# Patient Record
Sex: Female | Born: 1945 | Race: White | Hispanic: No | Marital: Married | Smoking: Never smoker
Health system: Southern US, Community
[De-identification: ages and names within clinical notes are randomized; demographics above are authoritative.]

## PROBLEM LIST (undated history)

## (undated) DIAGNOSIS — K5792 Diverticulitis of intestine, part unspecified, without perforation or abscess without bleeding: Secondary | ICD-10-CM

## (undated) DIAGNOSIS — E785 Hyperlipidemia, unspecified: Secondary | ICD-10-CM

## (undated) DIAGNOSIS — G9050 Complex regional pain syndrome I, unspecified: Secondary | ICD-10-CM

---

## 2017-11-08 ENCOUNTER — Emergency Department: Payer: Medicare Other

## 2017-11-08 ENCOUNTER — Other Ambulatory Visit: Payer: Self-pay

## 2017-11-08 ENCOUNTER — Inpatient Hospital Stay
Admission: EM | Admit: 2017-11-08 | Discharge: 2017-11-11 | DRG: 872 | Disposition: A | Discharge status: Home / Self Care | Payer: Medicare Other | Attending: Internal Medicine | Admitting: Internal Medicine

## 2017-11-08 ENCOUNTER — Encounter: Payer: Self-pay | Admitting: Radiology

## 2017-11-08 DIAGNOSIS — K5792 Diverticulitis of intestine, part unspecified, without perforation or abscess without bleeding: Secondary | ICD-10-CM | POA: Diagnosis present

## 2017-11-08 DIAGNOSIS — Z8249 Family history of ischemic heart disease and other diseases of the circulatory system: Secondary | ICD-10-CM | POA: Diagnosis not present

## 2017-11-08 DIAGNOSIS — K6289 Other specified diseases of anus and rectum: Secondary | ICD-10-CM | POA: Diagnosis present

## 2017-11-08 DIAGNOSIS — N3289 Other specified disorders of bladder: Secondary | ICD-10-CM | POA: Diagnosis present

## 2017-11-08 DIAGNOSIS — E785 Hyperlipidemia, unspecified: Secondary | ICD-10-CM | POA: Diagnosis present

## 2017-11-08 DIAGNOSIS — G9050 Complex regional pain syndrome I, unspecified: Secondary | ICD-10-CM | POA: Diagnosis present

## 2017-11-08 DIAGNOSIS — A419 Sepsis, unspecified organism: Secondary | ICD-10-CM | POA: Diagnosis present

## 2017-11-08 DIAGNOSIS — R102 Pelvic and perineal pain: Secondary | ICD-10-CM | POA: Diagnosis present

## 2017-11-08 DIAGNOSIS — R32 Unspecified urinary incontinence: Secondary | ICD-10-CM | POA: Diagnosis present

## 2017-11-08 DIAGNOSIS — R112 Nausea with vomiting, unspecified: Secondary | ICD-10-CM

## 2017-11-08 DIAGNOSIS — R197 Diarrhea, unspecified: Secondary | ICD-10-CM

## 2017-11-08 DIAGNOSIS — R109 Unspecified abdominal pain: Secondary | ICD-10-CM | POA: Diagnosis not present

## 2017-11-08 DIAGNOSIS — Z833 Family history of diabetes mellitus: Secondary | ICD-10-CM

## 2017-11-08 DIAGNOSIS — R1084 Generalized abdominal pain: Secondary | ICD-10-CM

## 2017-11-08 HISTORY — DX: Hyperlipidemia, unspecified: E78.5

## 2017-11-08 HISTORY — DX: Complex regional pain syndrome I, unspecified: G90.50

## 2017-11-08 LAB — LACTIC ACID, PLASMA
LACTIC ACID, VENOUS: 2 mmol/L — AB (ref 0.5–1.9)
Lactic Acid, Venous: 4.2 mmol/L (ref 0.5–1.9)

## 2017-11-08 LAB — URINALYSIS, COMPLETE (UACMP) WITH MICROSCOPIC
BILIRUBIN URINE: NEGATIVE
Bacteria, UA: NONE SEEN
GLUCOSE, UA: NEGATIVE mg/dL
Hgb urine dipstick: NEGATIVE
KETONES UR: NEGATIVE mg/dL
LEUKOCYTES UA: NEGATIVE
Nitrite: NEGATIVE
PROTEIN: NEGATIVE mg/dL
Specific Gravity, Urine: 1.036 — ABNORMAL HIGH (ref 1.005–1.030)
pH: 6 (ref 5.0–8.0)

## 2017-11-08 LAB — CBC
HEMATOCRIT: 44.2 % (ref 35.0–47.0)
HEMOGLOBIN: 15 g/dL (ref 12.0–16.0)
MCH: 31.3 pg (ref 26.0–34.0)
MCHC: 33.8 g/dL (ref 32.0–36.0)
MCV: 92.7 fL (ref 80.0–100.0)
Platelets: 333 10*3/uL (ref 150–440)
RBC: 4.77 MIL/uL (ref 3.80–5.20)
RDW: 12.8 % (ref 11.5–14.5)
WBC: 21.8 10*3/uL — ABNORMAL HIGH (ref 3.6–11.0)

## 2017-11-08 LAB — COMPREHENSIVE METABOLIC PANEL
ALBUMIN: 4.1 g/dL (ref 3.5–5.0)
ALK PHOS: 80 U/L (ref 38–126)
ALT: 23 U/L (ref 14–54)
ANION GAP: 15 (ref 5–15)
AST: 29 U/L (ref 15–41)
BUN: 16 mg/dL (ref 6–20)
CALCIUM: 9.3 mg/dL (ref 8.9–10.3)
CO2: 22 mmol/L (ref 22–32)
Chloride: 101 mmol/L (ref 101–111)
Creatinine, Ser: 0.78 mg/dL (ref 0.44–1.00)
GFR calc non Af Amer: >60 mL/min (ref >60)
GLUCOSE: 128 mg/dL — AB (ref 65–99)
POTASSIUM: 3.7 mmol/L (ref 3.5–5.1)
Sodium: 138 mmol/L (ref 135–145)
Total Bilirubin: 0.8 mg/dL (ref 0.3–1.2)
Total Protein: 7.2 g/dL (ref 6.5–8.1)

## 2017-11-08 LAB — LIPASE, BLOOD: LIPASE: 42 U/L (ref 11–51)

## 2017-11-08 MED ORDER — SODIUM CHLORIDE 0.9 % IV BOLUS (SEPSIS)
1000.0000 mL | Freq: Once | INTRAVENOUS | Status: AC
  Administered 2017-11-08: 1000 mL via INTRAVENOUS

## 2017-11-08 MED ORDER — ONDANSETRON HCL 4 MG/2ML IJ SOLN: 4.0000 mg | Freq: Four times a day (QID) | INTRAMUSCULAR | Status: DC | PRN

## 2017-11-08 MED ORDER — OXYCODONE HCL 5 MG PO TABS
5.0000 mg | ORAL_TABLET | ORAL | Status: DC | PRN
  Administered 2017-11-09 – 2017-11-10 (×4): 5 mg via ORAL
  Filled 2017-11-08 (×5): qty 1

## 2017-11-08 MED ORDER — PROMETHAZINE HCL 25 MG/ML IJ SOLN
12.5000 mg | Freq: Four times a day (QID) | INTRAMUSCULAR | Status: DC | PRN
  Administered 2017-11-09: 12.5 mg via INTRAVENOUS
  Filled 2017-11-08: qty 1

## 2017-11-08 MED ORDER — CIPROFLOXACIN IN D5W 400 MG/200ML IV SOLN
400.0000 mg | Freq: Once | INTRAVENOUS | Status: DC
  Filled 2017-11-08: qty 200

## 2017-11-08 MED ORDER — ONDANSETRON HCL 4 MG PO TABS: 4.0000 mg | ORAL_TABLET | Freq: Four times a day (QID) | ORAL | Status: DC | PRN

## 2017-11-08 MED ORDER — ONDANSETRON HCL 4 MG/2ML IJ SOLN
4.0000 mg | Freq: Four times a day (QID) | INTRAMUSCULAR | Status: AC
  Administered 2017-11-08 – 2017-11-10 (×8): 4 mg via INTRAVENOUS
  Filled 2017-11-08 (×8): qty 2

## 2017-11-08 MED ORDER — ACETAMINOPHEN 325 MG PO TABS
650.0000 mg | ORAL_TABLET | Freq: Four times a day (QID) | ORAL | Status: DC | PRN
  Administered 2017-11-08 – 2017-11-09 (×2): 650 mg via ORAL
  Filled 2017-11-08 (×3): qty 2

## 2017-11-08 MED ORDER — ONDANSETRON HCL 4 MG/2ML IJ SOLN
4.0000 mg | Freq: Once | INTRAMUSCULAR | Status: AC
  Administered 2017-11-08: 4 mg via INTRAVENOUS

## 2017-11-08 MED ORDER — METRONIDAZOLE IN NACL 5-0.79 MG/ML-% IV SOLN
500.0000 mg | Freq: Once | INTRAVENOUS | Status: AC
  Administered 2017-11-08: 500 mg via INTRAVENOUS
  Filled 2017-11-08: qty 100

## 2017-11-08 MED ORDER — SODIUM CHLORIDE 0.9 % IV SOLN
INTRAVENOUS | Status: DC
  Administered 2017-11-08 – 2017-11-10 (×4): via INTRAVENOUS

## 2017-11-08 MED ORDER — IOPAMIDOL (ISOVUE-300) INJECTION 61%
100.0000 mL | Freq: Once | INTRAVENOUS | Status: AC | PRN
  Administered 2017-11-08: 100 mL via INTRAVENOUS

## 2017-11-08 MED ORDER — METRONIDAZOLE IN NACL 5-0.79 MG/ML-% IV SOLN
500.0000 mg | Freq: Three times a day (TID) | INTRAVENOUS | Status: DC
  Administered 2017-11-08 – 2017-11-10 (×7): 500 mg via INTRAVENOUS
  Filled 2017-11-08 (×9): qty 100

## 2017-11-08 MED ORDER — ACETAMINOPHEN 650 MG RE SUPP
650.0000 mg | Freq: Four times a day (QID) | RECTAL | Status: DC | PRN
Start: 2017-11-08 — End: 2017-11-11

## 2017-11-08 MED ORDER — MORPHINE SULFATE (PF) 2 MG/ML IV SOLN
2.0000 mg | INTRAVENOUS | Status: DC | PRN
  Administered 2017-11-08 – 2017-11-09 (×6): 2 mg via INTRAVENOUS
  Filled 2017-11-08 (×7): qty 1

## 2017-11-08 MED ORDER — CIPROFLOXACIN IN D5W 400 MG/200ML IV SOLN
400.0000 mg | Freq: Two times a day (BID) | INTRAVENOUS | Status: DC
  Administered 2017-11-08 – 2017-11-09 (×4): 400 mg via INTRAVENOUS
  Filled 2017-11-08 (×6): qty 200

## 2017-11-08 MED ORDER — ONDANSETRON HCL 4 MG/2ML IJ SOLN
INTRAMUSCULAR | Status: AC
  Filled 2017-11-08: qty 2

## 2017-11-08 MED ORDER — MORPHINE SULFATE (PF) 4 MG/ML IV SOLN
4.0000 mg | Freq: Once | INTRAVENOUS | Status: AC
  Administered 2017-11-08: 4 mg via INTRAVENOUS
  Filled 2017-11-08: qty 1

## 2017-11-08 MED ORDER — ENOXAPARIN SODIUM 40 MG/0.4ML ~~LOC~~ SOLN
40.0000 mg | SUBCUTANEOUS | Status: DC
  Administered 2017-11-08 – 2017-11-10 (×3): 40 mg via SUBCUTANEOUS
  Filled 2017-11-08 (×3): qty 0.4

## 2017-11-08 NOTE — ED Notes (Signed)
Pt assisted up to commode for diarrhea.  

## 2017-11-08 NOTE — H&P (Signed)
Sonia Ochoa at [place] NAME: Sonia Ochoa    MR#:  [id number]  DATE OF BIRTH:  02/02/1946  DATE OF ADMISSION:  11/08/2017  PRIMARY CARE PHYSICIAN: Patient, No Pcp Per   REQUESTING/REFERRING PHYSICIAN:   CHIEF COMPLAINT:   Chief Complaint  Patient presents with  . Abdominal Pain  . Emesis  . Diarrhea    HISTORY OF PRESENT ILLNESS:  Sonia Ochoa  is a 72 y.o. female with a known history of hyperlipidemia and reflex sympathetic dystrophy presents to hospital secondary to worsening abdominal pain, nausea and vomiting. Patient has known history of diverticulosis. She also has episodes of constipation and does not avoid foods that can trigger these episodes. She said she might have eaten some chicken yesterday. She was fine during the day, woke up around midnight with severe abdominal pain with nausea and vomiting. Also had a few episodes of diarrhea. Diarrhea has resolved now. CT of the abdomen showing diverticulitis. WBC and lactic acid are elevated.  Patient is being admitted for sepsis secondary to diverticulitis No fevers now. Complaints of weakness, severe nausea and vomiting along with abdominal pain.  PAST MEDICAL HISTORY:   Past Medical History:  Diagnosis Date  . Hyperlipidemia   . Reflex sympathetic dystrophy     PAST SURGICAL HISTORY:  History reviewed. No pertinent surgical history.  SOCIAL HISTORY:   Social History   Tobacco Use  . Smoking status: Never Smoker  . Smokeless tobacco: Never Used  Substance Use Topics  . Alcohol use: Yes    Frequency: Never    Comment: social drinking    FAMILY HISTORY:   Family History  Problem Relation Age of Onset  . Multiple myeloma Father   . CAD Father   . Diabetes Sister     DRUG ALLERGIES:   Allergies  Allergen Reactions  . Levofloxacin Other (See Comments)    Reaction: Chest pain and "felt like her heart was going to stop"    REVIEW OF SYSTEMS:     Review of Systems  Constitutional: Positive for fever and malaise/fatigue. Negative for chills and weight loss.  HENT: Negative for ear discharge, ear pain, hearing loss and nosebleeds.   Eyes: Negative for blurred vision, double vision and photophobia.  Respiratory: Negative for cough, hemoptysis, shortness of breath and wheezing.   Cardiovascular: Negative for chest pain, palpitations, orthopnea and leg swelling.  Gastrointestinal: Positive for abdominal pain, diarrhea, nausea and vomiting. Negative for constipation, heartburn and melena.  Genitourinary: Negative for dysuria, frequency and urgency.  Musculoskeletal: Negative for back pain, myalgias and neck pain.  Skin: Negative for rash.  Neurological: Negative for dizziness, sensory change, speech change, focal weakness and headaches.  Endo/Heme/Allergies: Does not bruise/bleed easily.  Psychiatric/Behavioral: Negative for depression.    MEDICATIONS AT HOME:   Prior to Admission medications   Not on File      VITAL SIGNS:  Blood pressure 127/64, pulse (!) 108, temperature 98.9 F (37.2 C), temperature source Oral, resp. rate 16, height 5' 4"  (1.626 m), weight 68 kg (150 lb), SpO2 93 %.  PHYSICAL EXAMINATION:   Physical Exam  GENERAL:  72 y.o.-year-old patient lying in the bed with no acute distress.  EYES: Pupils equal, round, reactive to light and accommodation. No scleral icterus. Extraocular muscles intact.  HEENT: Head atraumatic, normocephalic. Oropharynx and nasopharynx clear.  NECK:  Supple, no jugular venous distention. No thyroid enlargement, no tenderness.  LUNGS: Normal breath sounds bilaterally, no wheezing, rales,rhonchi or  crepitation. No use of accessory muscles of respiration.  CARDIOVASCULAR: S1, S2 normal. No murmurs, rubs, or gallops.  ABDOMEN: significantly tender, voluntary guarding, no rigidity or rebound tenderness. nondistended. Bowel sounds present. No organomegaly or mass.  EXTREMITIES: No pedal  edema, cyanosis, or clubbing.  NEUROLOGIC: Cranial nerves II through XII are intact. Muscle strength 5/5 in all extremities. Sensation intact. Gait not checked.  PSYCHIATRIC: The patient is alert and oriented x 3.  SKIN: No obvious rash, lesion, or ulcer.   LABORATORY PANEL:   CBC Recent Labs  Lab 11/08/17 0346  WBC 21.8*  HGB 15.0  HCT 44.2  PLT 333   ------------------------------------------------------------------------------------------------------------------  Chemistries  Recent Labs  Lab 11/08/17 0346  NA 138  K 3.7  CL 101  CO2 22  GLUCOSE 128*  BUN 16  CREATININE 0.78  CALCIUM 9.3  AST 29  ALT 23  ALKPHOS 80  BILITOT 0.8   ------------------------------------------------------------------------------------------------------------------  Cardiac Enzymes No results for input(s): TROPONINI in the last 168 hours. ------------------------------------------------------------------------------------------------------------------  RADIOLOGY:  Ct Abdomen Pelvis W Contrast  Result Date: 11/08/2017 CLINICAL DATA:  72 y/o F; sudden onset abdominal cramping, nausea, vomiting, and diarrhea 1 hour prior to admission. EXAM: CT ABDOMEN AND PELVIS WITH CONTRAST TECHNIQUE: Multidetector CT imaging of the abdomen and pelvis was performed using the standard protocol following bolus administration of intravenous contrast. CONTRAST:  172m ISOVUE-300 IOPAMIDOL (ISOVUE-300) INJECTION 61% COMPARISON:  None. FINDINGS: Lower chest: Small hiatal hernia. Moderate coronary artery calcification. Hepatobiliary: 10 mm lucency in liver segment 6 (series 2, image 25), likely cyst. No other focal liver lesion identified. Normal gallbladder. No biliary dilatation. Pancreas: Unremarkable. No pancreatic ductal dilatation or surrounding inflammatory changes. Spleen: Normal in size without focal abnormality. Adrenals/Urinary Tract: Adrenal glands are unremarkable. Kidneys are normal, without renal  calculi, focal lesion, or hydronephrosis. Bladder is unremarkable. Stomach/Bowel: Sigmoid diverticulosis with mild mucosal enhancement and wall thickening likely representing acute diverticulitis. No findings of perforation or abscess. No obstructive changes of bowel. Normal appendix. Vascular/Lymphatic: Aortic atherosclerosis. No enlarged abdominal or pelvic lymph nodes. Reproductive: Status post hysterectomy. No adnexal masses. Other: No abdominal wall hernia or abnormality. No abdominopelvic ascites. Musculoskeletal: No fracture is seen. IMPRESSION: 1. Mild acute diverticulitis. No findings of perforation or abscess. 2. Small hiatal hernia. 3. Aortic atherosclerosis and coronary artery calcification. Electronically Signed   By: LKristine GarbeM.D.   On: 11/08/2017 06:34    EKG:   Orders placed or performed during the hospital encounter of 11/08/17  . EKG 12-Lead  . EKG 12-Lead  . ED EKG  . ED EKG    IMPRESSION AND PLAN:   KRachal Ochoa is a 72y.o. female with a known history of hyperlipidemia and reflex sympathetic dystrophy presents to hospital secondary to worsening abdominal pain, nausea and vomiting.  1. Sepsis- secondary to acute diverticulitis. -WBC and lactic acid are elevated. Monitor for any complications. -CT of the abdomen on admission negative for any abscess or perforation -Started on Cipro and Flagyl -IV fluids. If symptoms worsen-we'll get a surgical consult -currently on contact isolation due to diarrhea. Stool studies are pending.  2.leukocytosis-secondary to above. Monitor on antibiotics  3. DVT prophylaxis-Lovenox  Updated family at bedside    All the records are reviewed and case discussed with ED provider. Management plans discussed with the patient, family and they are in agreement.  CODE STATUS: Full Code  TOTAL TIME TAKING CARE OF THIS PATIENT: 50 minutes.    KGladstone LighterM.D on 11/08/2017 at 10:44  AM  Between 7am to 6pm -  Pager - [phone]  After 6pm go to [email or web address] - password [email or web address] [facility]  Office  [phone]  CC: Primary care physician; Patient, No Pcp Per

## 2017-11-08 NOTE — ED Notes (Signed)
Pt cleansed of large amount of diarrhea in bed x3. Immediately after cleansing pt each time pt with diarrhea in bed. Pt states she cannot use bedpan due to "pain" on her buttocks. Pt trashing around in bed, difficult to leave cardiac monitor leads in place.

## 2017-11-08 NOTE — ED Notes (Signed)
Patient transported to CT 

## 2017-11-08 NOTE — Progress Notes (Signed)
CRITICAL VALUE ALERT  Critical Value:  Lactic Acid 4.2  Date & Time Notied:  11/08/2017 1000  Provider Notified: Nemiah CommanderKalisetti   Orders Received/Actions taken:No new orders. Will continue to monitor.

## 2017-11-08 NOTE — ED Notes (Signed)
Pt cleansed of small amount of incontinent stool in brief. Pt informed will need additional stool for stool sample. Pt continues to decline use of bedpan, states "it hurts my butt". Pt notified order for rectal tube received for sample collection. Pt verbalizes understanding.

## 2017-11-08 NOTE — ED Provider Notes (Signed)
[facility] Emergency Department Provider Note   ____________________________________________   First MD Initiated Contact with Patient 11/08/17 [phone]     (approximate)  I have reviewed the triage vital signs and the nursing notes.   HISTORY  Chief Complaint Abdominal Pain; Emesis; and Diarrhea    HPI Sonia Ochoa is a 72 y.o. female who comes into the hospital today with some vomiting diarrhea and abdominal pain.  The patient states that she vomited 3 times.  She reports that her emesis was brown and her diarrhea was brown.  She reports that she has pain all over her abdomen.  She took some Tylenol at home but it did not help.  The pain is in her mid abdomen to the top of her abdomen.  She denies any sick contacts.  She states that she has been out in public so she is unsure if she was in contact with someone sick then.  The patient rates her pain a 10 out of 10 in intensity.  She was feeling sweaty and she is never had these symptoms before.  She is here today for evaluation.  History reviewed. No pertinent past medical history.  There are no active problems to display for this patient.   History reviewed. No pertinent surgical history.  Prior to Admission medications   Not on File    Allergies Levaquin [levofloxacin in d5w]  No family history on file.  Social History Social History   Tobacco Use  . Smoking status: Never Smoker  . Smokeless tobacco: Never Used  Substance Use Topics  . Alcohol use: No    Frequency: Never  . Drug use: No    Review of Systems  Constitutional: No fever/chills Eyes: No visual changes. ENT: No sore throat. Cardiovascular: Denies chest pain. Respiratory: Denies shortness of breath. Gastrointestinal: abdominal pain.   nausea,  vomiting.   diarrhea.  No constipation. Genitourinary: Negative for dysuria. Musculoskeletal: Negative for back pain. Skin: Negative for rash. Neurological: Negative for  headaches, focal weakness or numbness.   ____________________________________________   PHYSICAL EXAM:  VITAL SIGNS: ED Triage Vitals  Enc Vitals Group     BP 11/08/17 0400 124/62     Pulse Rate 11/08/17 0400 99     Resp 11/08/17 0400 16     Temp 11/08/17 0354 98.9 F (37.2 C)     Temp Source 11/08/17 0354 Oral     SpO2 11/08/17 0400 91 %     Weight 11/08/17 0354 150 lb (68 kg)     Height 11/08/17 0354 5\' 4"  (1.626 m)     Head Circumference --      Peak Flow --      Pain Score 11/08/17 0354 4     Pain Loc --      Pain Edu? --      Excl. in GC? --     Constitutional: Alert and oriented. Well appearing and in moderate distress. Eyes: Conjunctivae are normal. PERRL. EOMI. Head: Atraumatic. Nose: No congestion/rhinnorhea. Mouth/Throat: Mucous membranes are moist.  Oropharynx non-erythematous. Cardiovascular: Normal rate, regular rhythm. Grossly normal heart sounds.  Good peripheral circulation. Respiratory: Normal respiratory effort.  No retractions. Lungs CTAB. Gastrointestinal: Soft with some diffuse tenderness to palpation. No distention.  Positive bowel sounds Musculoskeletal: No lower extremity tenderness nor edema.   Neurologic:  Normal speech and language.  Skin:  Skin is warm, dry and intact.  Psychiatric: Mood and affect are normal.  ____________________________________________   LABS (all labs ordered are  listed, but only abnormal results are displayed)  Labs Reviewed  COMPREHENSIVE METABOLIC PANEL - Abnormal; Notable for the following components:      Result Value   Glucose, Bld 128 (*)    All other components within normal limits  CBC - Abnormal; Notable for the following components:   WBC 21.8 (*)    All other components within normal limits  LACTIC ACID, PLASMA - Abnormal; Notable for the following components:   Lactic Acid, Venous 2.0 (*)    All other components within normal limits  GASTROINTESTINAL PANEL BY PCR, STOOL (REPLACES STOOL CULTURE)    LIPASE, BLOOD  URINALYSIS, COMPLETE (UACMP) WITH MICROSCOPIC  LACTIC ACID, PLASMA   ____________________________________________  EKG  ED ECG REPORT I, Rebecka Apley, the attending physician, personally viewed and interpreted this ECG.   Date: 11/08/2017  EKG Time: 0343  Rate: 92  Rhythm: normal sinus rhythm  Axis: normal  Intervals:none  ST&T Change: none  ____________________________________________  RADIOLOGY  ED MD interpretation:  CT abd and pelvis: Mild acute diverticulitis with no findings of perforation or abscess  Official radiology report(s): Ct Abdomen Pelvis W Contrast  Result Date: 11/08/2017 CLINICAL DATA:  72 y/o F; sudden onset abdominal cramping, nausea, vomiting, and diarrhea 1 hour prior to admission. EXAM: CT ABDOMEN AND PELVIS WITH CONTRAST TECHNIQUE: Multidetector CT imaging of the abdomen and pelvis was performed using the standard protocol following bolus administration of intravenous contrast. CONTRAST:  ISOVUE-300 IOPAMIDOL (ISOVUE-300) INJECTION 61% COMPARISON:  None. FINDINGS: Lower chest: Small hiatal hernia. Moderate coronary artery calcification. Hepatobiliary: 10 mm lucency in liver segment 6 (series 2, image 25), likely cyst. No other focal liver lesion identified. Normal gallbladder. No biliary dilatation. Pancreas: Unremarkable. No pancreatic ductal dilatation or surrounding inflammatory changes. Spleen: Normal in size without focal abnormality. Adrenals/Urinary Tract: Adrenal glands are unremarkable. Kidneys are normal, without renal calculi, focal lesion, or hydronephrosis. Bladder is unremarkable. Stomach/Bowel: Sigmoid diverticulosis with mild mucosal enhancement and wall thickening likely representing acute diverticulitis. No findings of perforation or abscess. No obstructive changes of bowel. Normal appendix. Vascular/Lymphatic: Aortic atherosclerosis. No enlarged abdominal or pelvic lymph nodes. Reproductive: Status post  hysterectomy. No adnexal masses. Other: No abdominal wall hernia or abnormality. No abdominopelvic ascites. Musculoskeletal: No fracture is seen. IMPRESSION: 1. Mild acute diverticulitis. No findings of perforation or abscess. 2. Small hiatal hernia. 3. Aortic atherosclerosis and coronary artery calcification. Electronically Signed   By: Mitzi Hansen M.D.   On: 11/08/2017 06:34    ____________________________________________   PROCEDURES  Procedure(s) performed: None  Procedures  Critical Care performed: No  ____________________________________________   INITIAL IMPRESSION / ASSESSMENT AND PLAN / ED COURSE  As part of my medical decision making, I reviewed the following data within the electronic MEDICAL RECORD NUMBER Notes from prior ED visits and [place] Controlled Substance Database   This is a 72 year old female who comes into the hospital today with some abdominal pain nausea vomiting and diarrhea.  Differential diagnosis includes pancreatitis, gastroenteritis, diverticulitis.  I did check some blood work on the patient to include a CBC CMP and a lipase.  The patient CBC showed a white blood cell count of 21.8.  The patient's CMP was negative and her lipase was negative.  I also ordered a lactic acid with a concern for possible mesenteric ischemia which was 2.  I sent the patient for a CT scan of her abdomen and pelvis and it showed that she does have some diverticulitis.  I ordered some ciprofloxacin  and Flagyl for the patient as well as some morphine and Zofran.  She will be admitted to the hospitalist service for her diverticulitis.      ____________________________________________   FINAL CLINICAL IMPRESSION(S) / ED DIAGNOSES  Final diagnoses:  Diverticulitis  Nausea vomiting and diarrhea  Generalized abdominal pain     ED Discharge Orders    None       Note:  This document was prepared using Dragon voice recognition software and may include unintentional  dictation errors.    Rebecka Apley, MD 11/08/17 [phone]

## 2017-11-08 NOTE — ED Triage Notes (Signed)
Pt with sudden onset of abd cramping, nausea, vomiting and diarrhea one hour pta. Pt states she felt faint while having emesis.

## 2017-11-08 NOTE — ED Notes (Signed)
Pt assisted up to commode to have diarrhea, no diarrhea produced, pt with emesis while up. Pt became pale, stated she felt like she was going to faint. Pt assisted back to bed, skin color improved while lying down. Pt informed she will need to use bedpan for diarrhea in future.

## 2017-11-08 NOTE — ED Notes (Signed)
Report to hunter, rn.  

## 2017-11-08 NOTE — ED Notes (Signed)
Critical lactic called from lab of 2.0 dr. Zenda AlpersWebster notified, no new orders received.

## 2017-11-08 NOTE — Progress Notes (Signed)
Dinamap was one Hour Behind Vitals were take 19:45

## 2017-11-09 LAB — BASIC METABOLIC PANEL
ANION GAP: 9 (ref 5–15)
BUN: 8 mg/dL (ref 6–20)
CHLORIDE: 105 mmol/L (ref 101–111)
CO2: 24 mmol/L (ref 22–32)
Calcium: 8 mg/dL — ABNORMAL LOW (ref 8.9–10.3)
Creatinine, Ser: 0.74 mg/dL (ref 0.44–1.00)
GFR calc Af Amer: >60 mL/min (ref >60)
GLUCOSE: 148 mg/dL — AB (ref 65–99)
POTASSIUM: 3.5 mmol/L (ref 3.5–5.1)
Sodium: 138 mmol/L (ref 135–145)

## 2017-11-09 LAB — C DIFFICILE QUICK SCREEN W PCR REFLEX
C DIFFICILE (CDIFF) INTERP: NOT DETECTED
C DIFFICILE (CDIFF) TOXIN: NEGATIVE
C Diff antigen: NEGATIVE

## 2017-11-09 LAB — CBC
HEMATOCRIT: 39.4 % (ref 35.0–47.0)
HEMOGLOBIN: 13.3 g/dL (ref 12.0–16.0)
MCH: 31.2 pg (ref 26.0–34.0)
MCHC: 33.9 g/dL (ref 32.0–36.0)
MCV: 92 fL (ref 80.0–100.0)
Platelets: 247 10*3/uL (ref 150–440)
RBC: 4.28 MIL/uL (ref 3.80–5.20)
RDW: 13.1 % (ref 11.5–14.5)
WBC: 11.6 10*3/uL — AB (ref 3.6–11.0)

## 2017-11-09 MED ORDER — RISAQUAD PO CAPS
1.0000 | ORAL_CAPSULE | Freq: Every day | ORAL | Status: DC
  Administered 2017-11-09 – 2017-11-11 (×3): 1 via ORAL
  Filled 2017-11-09 (×3): qty 1

## 2017-11-09 NOTE — Progress Notes (Signed)
Per Dr. Nemiah CommanderKalisetti okay to place order for K Pad

## 2017-11-09 NOTE — Progress Notes (Signed)
Per Dr. Nemiah CommanderKalisetti send off sample to test for C Diff. RN to place order.

## 2017-11-09 NOTE — Progress Notes (Addendum)
[facility] [place] [facility]   PATIENT NAME: Sonia ModestKatherine Ochoa    MR#:  [id number]  DATE OF BIRTH:  08/13/1946  SUBJECTIVE:  CHIEF COMPLAINT:   Chief Complaint  Patient presents with  . Abdominal Pain  . Emesis  . Diarrhea   - some better compared to yesterday - still with nausea, abdominal pain and diarrhea today  REVIEW OF SYSTEMS:  Review of Systems  Constitutional: Positive for malaise/fatigue. Negative for chills and fever.  Eyes: Negative for blurred vision.  Respiratory: Negative for cough, shortness of breath and wheezing.   Cardiovascular: Negative for chest pain and palpitations.  Gastrointestinal: Positive for abdominal pain, diarrhea and nausea. Negative for constipation and vomiting.  Genitourinary: Negative for dysuria.  Musculoskeletal: Positive for myalgias.  Neurological: Negative for dizziness, speech change, focal weakness, seizures and headaches.  Psychiatric/Behavioral: Negative for depression.    DRUG ALLERGIES:   Allergies  Allergen Reactions  . Levofloxacin Other (See Comments)    Reaction: Chest pain and "felt like her heart was going to stop"    VITALS:  Blood pressure 131/63, pulse 97, temperature 98.3 F (36.8 C), temperature source Oral, resp. rate 18, height 5\' 4"  (1.626 m), weight 68 kg (150 lb), SpO2 96 %.  PHYSICAL EXAMINATION:  Physical Exam  GENERAL:  72 y.o.-year-old patient lying in the bed with no acute distress.  EYES: Pupils equal, round, reactive to light and accommodation. No scleral icterus. Extraocular muscles intact.  HEENT: Head atraumatic, normocephalic. Oropharynx and nasopharynx clear.  NECK:  Supple, no jugular venous distention. No thyroid enlargement, no tenderness.  LUNGS: Normal breath sounds bilaterally, no wheezing, rales,rhonchi or crepitation. No use of accessory muscles of respiration.  CARDIOVASCULAR: S1, S2 normal. No murmurs, rubs, or gallops.  ABDOMEN: significantly  tender, voluntary guarding, no rigidity or rebound tenderness. nondistended. Bowel sounds present. No organomegaly or mass.  EXTREMITIES: No pedal edema, cyanosis, or clubbing.  NEUROLOGIC: Cranial nerves II through XII are intact. Muscle strength 5/5 in all extremities. Sensation intact. Gait not checked.  PSYCHIATRIC: The patient is alert and oriented x 3.  SKIN: No obvious rash, lesion, or ulcer.    LABORATORY PANEL:   CBC Recent Labs  Lab 11/09/17 0649  WBC 11.6*  HGB 13.3  HCT 39.4  PLT 247   ------------------------------------------------------------------------------------------------------------------  Chemistries  Recent Labs  Lab 11/08/17 0346 11/09/17 0649  NA 138 138  K 3.7 3.5  CL 101 105  CO2 22 24  GLUCOSE 128* 148*  BUN 16 8  CREATININE 0.78 0.74  CALCIUM 9.3 8.0*  AST 29  --   ALT 23  --   ALKPHOS 80  --   BILITOT 0.8  --    ------------------------------------------------------------------------------------------------------------------  Cardiac Enzymes No results for input(s): TROPONINI in the last 168 hours. ------------------------------------------------------------------------------------------------------------------  RADIOLOGY:  Ct Abdomen Pelvis W Contrast  Result Date: 11/08/2017 CLINICAL DATA:  72 y/o F; sudden onset abdominal cramping, nausea, vomiting, and diarrhea 1 hour prior to admission. EXAM: CT ABDOMEN AND PELVIS WITH CONTRAST TECHNIQUE: Multidetector CT imaging of the abdomen and pelvis was performed using the standard protocol following bolus administration of intravenous contrast. CONTRAST:  100mL ISOVUE-300 IOPAMIDOL (ISOVUE-300) INJECTION 61% COMPARISON:  None. FINDINGS: Lower chest: Small hiatal hernia. Moderate coronary artery calcification. Hepatobiliary: 10 mm lucency in liver segment 6 (series 2, image 25), likely cyst. No other focal liver lesion identified. Normal gallbladder. No biliary dilatation. Pancreas:  Unremarkable. No pancreatic ductal dilatation or surrounding inflammatory changes. Spleen: Normal  in size without focal abnormality. Adrenals/Urinary Tract: Adrenal glands are unremarkable. Kidneys are normal, without renal calculi, focal lesion, or hydronephrosis. Bladder is unremarkable. Stomach/Bowel: Sigmoid diverticulosis with mild mucosal enhancement and wall thickening likely representing acute diverticulitis. No findings of perforation or abscess. No obstructive changes of bowel. Normal appendix. Vascular/Lymphatic: Aortic atherosclerosis. No enlarged abdominal or pelvic lymph nodes. Reproductive: Status post hysterectomy. No adnexal masses. Other: No abdominal wall hernia or abnormality. No abdominopelvic ascites. Musculoskeletal: No fracture is seen. IMPRESSION: 1. Mild acute diverticulitis. No findings of perforation or abscess. 2. Small hiatal hernia. 3. Aortic atherosclerosis and coronary artery calcification. Electronically Signed   By: Mitzi Hansen M.D.   On: 11/08/2017 06:34    EKG:   Orders placed or performed during the hospital encounter of 11/08/17  . EKG 12-Lead  . EKG 12-Lead  . ED EKG  . ED EKG    ASSESSMENT AND PLAN:   [name]  is a 72 y.o. female with a known history of hyperlipidemia and reflex sympathetic dystrophy presents to hospital secondary to worsening abdominal pain, nausea and vomiting.  1. Sepsis- secondary to acute diverticulitis. - stool studies pending - added probiotics - improved wbc. -CT of the abdomen on admission negative for any abscess or perforation -on Cipro and Flagyl - advance diet today -IV fluids.   2.leukocytosis-secondary to above. Improving on antibiotics  3. DVT prophylaxis-Lovenox  4. RDS- pain meds prn  Updated family at bedside       All the records are reviewed and case discussed with Care Management/Social Workerr. Management plans discussed with the patient, family and they are in  agreement.  CODE STATUS:  Full Code  TOTAL TIME TAKING CARE OF THIS PATIENT: 38 minutes.   POSSIBLE D/C IN 2 DAYS, DEPENDING ON CLINICAL CONDITION.   [name] M.D on 11/09/2017 at 12:12 PM  Between 7am to 6pm - Pager - [phone]  After 6pm go to [email or web address] - password [email or web address]  [facility]  Office  [phone]  CC: Primary care physician; Patient, No Pcp Per

## 2017-11-10 LAB — MISC LABCORP TEST (SEND OUT): LABCORP TEST CODE: 183480

## 2017-11-10 LAB — CBC
HCT: 36.3 % (ref 35.0–47.0)
Hemoglobin: 12.6 g/dL (ref 12.0–16.0)
MCH: 31.9 pg (ref 26.0–34.0)
MCHC: 34.7 g/dL (ref 32.0–36.0)
MCV: 92 fL (ref 80.0–100.0)
PLATELETS: 228 10*3/uL (ref 150–440)
RBC: 3.94 MIL/uL (ref 3.80–5.20)
RDW: 13.2 % (ref 11.5–14.5)
WBC: 11.4 10*3/uL — ABNORMAL HIGH (ref 3.6–11.0)

## 2017-11-10 LAB — URINALYSIS, COMPLETE (UACMP) WITH MICROSCOPIC
Bilirubin Urine: NEGATIVE
GLUCOSE, UA: NEGATIVE mg/dL
Ketones, ur: NEGATIVE mg/dL
Nitrite: NEGATIVE
PH: 6 (ref 5.0–8.0)
Protein, ur: NEGATIVE mg/dL
Specific Gravity, Urine: 1.002 — ABNORMAL LOW (ref 1.005–1.030)

## 2017-11-10 MED ORDER — OXYBUTYNIN CHLORIDE 5 MG PO TABS: 5.0000 mg | ORAL_TABLET | Freq: Three times a day (TID) | ORAL | Status: DC | PRN

## 2017-11-10 MED ORDER — LOPERAMIDE HCL 2 MG PO CAPS
2.0000 mg | ORAL_CAPSULE | Freq: Four times a day (QID) | ORAL | Status: DC | PRN
  Administered 2017-11-10 (×2): 2 mg via ORAL
  Filled 2017-11-10 (×2): qty 1

## 2017-11-10 MED ORDER — BELLADONNA ALKALOIDS-OPIUM 16.2-60 MG RE SUPP
1.0000 | Freq: Three times a day (TID) | RECTAL | Status: DC
  Administered 2017-11-10 (×3): 1 via RECTAL
  Filled 2017-11-10 (×4): qty 1

## 2017-11-10 MED ORDER — CIPROFLOXACIN HCL 500 MG PO TABS
500.0000 mg | ORAL_TABLET | Freq: Two times a day (BID) | ORAL | Status: DC
  Administered 2017-11-10 – 2017-11-11 (×3): 500 mg via ORAL
  Filled 2017-11-10 (×3): qty 1

## 2017-11-10 MED ORDER — METRONIDAZOLE 500 MG PO TABS
500.0000 mg | ORAL_TABLET | Freq: Three times a day (TID) | ORAL | Status: DC
  Administered 2017-11-10 – 2017-11-11 (×3): 500 mg via ORAL
  Filled 2017-11-10 (×5): qty 1

## 2017-11-10 NOTE — Progress Notes (Signed)
Per Dr. Nemiah CommanderKalisetti okay for pt not to have IV access. IV was removed by patient but she is no longer requiring IV pain or nausea meds.

## 2017-11-10 NOTE — Progress Notes (Signed)
[facility] [place] at [facility]   PATIENT NAME: Sonia Ochoa    MR#:  [id number]  DATE OF BIRTH:  [birth year]  SUBJECTIVE:  CHIEF COMPLAINT:   Chief Complaint  Patient presents with  . Abdominal Pain  . Emesis  . Diarrhea   - complaints of pain and burning whenever trying to urinate -Also rectal pain. Diarrhea is improving. C. Difficile is negative  REVIEW OF SYSTEMS:  Review of Systems  Constitutional: Positive for malaise/fatigue. Negative for chills and fever.  Eyes: Negative for blurred vision.  Respiratory: Negative for cough, shortness of breath and wheezing.   Cardiovascular: Negative for chest pain and palpitations.  Gastrointestinal: Positive for abdominal pain, diarrhea and nausea. Negative for constipation and vomiting.  Genitourinary: Negative for dysuria.  Musculoskeletal: Positive for myalgias.  Neurological: Negative for dizziness, speech change, focal weakness, seizures and headaches.  Psychiatric/Behavioral: Negative for depression.    DRUG ALLERGIES:   Allergies  Allergen Reactions  . Levofloxacin Other (See Comments)    Reaction: Chest pain and "felt like her heart was going to stop"    VITALS:  Blood pressure 138/71, pulse 91, temperature 98.3 F (36.8 C), temperature source Oral, resp. rate 18, height 5\' 4"  (1.626 m), weight 68 kg (150 lb), SpO2 97 %.  PHYSICAL EXAMINATION:  Physical Exam  GENERAL:  72 y.o.-year-old patient lying in the bed with no acute distress.  EYES: Pupils equal, round, reactive to light and accommodation. No scleral icterus. Extraocular muscles intact.  HEENT: Head atraumatic, normocephalic. Oropharynx and nasopharynx clear.  NECK:  Supple, no jugular venous distention. No thyroid enlargement, no tenderness.  LUNGS: Normal breath sounds bilaterally, no wheezing, rales,rhonchi or crepitation. No use of accessory muscles of respiration.  CARDIOVASCULAR: S1, S2 normal. No murmurs, rubs, or  gallops.  ABDOMEN: significantly tender, voluntary guarding, no rigidity or rebound tenderness. nondistended. Bowel sounds present. No organomegaly or mass.  Genitourinary-some erythema and tenderness on labial folds and also in the perineum EXTREMITIES: No pedal edema, cyanosis, or clubbing.  NEUROLOGIC: Cranial nerves II through XII are intact. Muscle strength 5/5 in all extremities. Sensation intact. Gait not checked.  PSYCHIATRIC: The patient is alert and oriented x 3.  SKIN: No obvious rash, lesion, or ulcer.    LABORATORY PANEL:   CBC Recent Labs  Lab 11/10/17 0418  WBC 11.4*  HGB 12.6  HCT 36.3  PLT 228   ------------------------------------------------------------------------------------------------------------------  Chemistries  Recent Labs  Lab 11/08/17 0346 11/09/17 0649  NA 138 138  K 3.7 3.5  CL 101 105  CO2 22 24  GLUCOSE 128* 148*  BUN 16 8  CREATININE 0.78 0.74  CALCIUM 9.3 8.0*  AST 29  --   ALT 23  --   ALKPHOS 80  --   BILITOT 0.8  --    ------------------------------------------------------------------------------------------------------------------  Cardiac Enzymes No results for input(s): TROPONINI in the last 168 hours. ------------------------------------------------------------------------------------------------------------------  RADIOLOGY:  No results found.  EKG:   Orders placed or performed during the hospital encounter of 11/08/17  . EKG 12-Lead  . EKG 12-Lead  . ED EKG  . ED EKG    ASSESSMENT AND PLAN:   Sonia Ochoa  is a 72 y.o. female with a known history of hyperlipidemia and reflex sympathetic dystrophy presents to hospital secondary to worsening abdominal pain, nausea and vomiting.  1. Sepsis- secondary to acute diverticulitis. -C. Difficile is negative, GI panel is pending. Continue Imodium as needed - added probiotics - improved wbc. -CT of the  abdomen on admission negative for any abscess or  perforation -on Cipro and Flagyl-change to oral - advance diet today -can DC IV fluids today  2.leukocytosis-secondary to above. Improving on antibiotics  3. DVT prophylaxis-Lovenox  4. RDS- pain meds prn  5. Perineal pain-secondary to skin breakdown from incontinence and also recent diarrhea. Discontinue rectal tube -Barrier ointment twice a day. Repeat urine analysis ordered. -Belladonna suppositories ordered for bladder spasms       All the records are reviewed and case discussed with Care Management/Social Workerr. Management plans discussed with the patient, family and they are in agreement.  CODE STATUS:  Full Code  TOTAL TIME TAKING CARE OF THIS PATIENT: 38 minutes.   POSSIBLE D/C TOMORROW, DEPENDING ON CLINICAL CONDITION.   Enid BaasKALISETTI,Brianna Esson M.D on 11/10/2017 at 11:02 AM  Between 7am to 6pm - Pager - [phone]  After 6pm go to [email or web address] - password [email or web address]  [facility]  Office  [phone]  CC: Primary care physician; Patient, No Pcp Per

## 2017-11-11 MED ORDER — BELLADONNA ALKALOIDS-OPIUM 16.2-60 MG RE SUPP: 1.0000 | Freq: Three times a day (TID) | RECTAL | 0 refills | Status: DC | PRN

## 2017-11-11 MED ORDER — CIPROFLOXACIN HCL 500 MG PO TABS: 500.0000 mg | ORAL_TABLET | Freq: Two times a day (BID) | ORAL | 0 refills | Status: AC

## 2017-11-11 MED ORDER — RISAQUAD PO CAPS: 1.0000 | ORAL_CAPSULE | Freq: Every day | ORAL | 0 refills | Status: AC

## 2017-11-11 MED ORDER — METRONIDAZOLE 500 MG PO TABS: 500.0000 mg | ORAL_TABLET | Freq: Three times a day (TID) | ORAL | 0 refills | Status: AC

## 2017-11-11 MED ORDER — OXYCODONE HCL 5 MG PO TABS: 5.0000 mg | ORAL_TABLET | Freq: Four times a day (QID) | ORAL | 0 refills | Status: DC | PRN

## 2017-11-11 NOTE — Care Management Important Message (Signed)
Important Message  Patient Details  Name: Sonia Ochoa MRN: [id number] Date of Birth: 05/07/1946   Medicare Important Message Given:  Yes    Chapman FitchBOWEN, Greysin Medlen T, RN 11/11/2017, 11:39 AM

## 2017-11-11 NOTE — Discharge Summary (Signed)
[facility] -  at [facility]   PATIENT NAME: Sonia Ochoa    MR#:  [id number]  DATE OF BIRTH:  03/30/1946  DATE OF ADMISSION:  11/08/2017   ADMITTING PHYSICIAN: [name], MD  DATE OF DISCHARGE:  11/11/17  PRIMARY CARE PHYSICIAN: Patient, No Pcp Per   ADMISSION DIAGNOSIS:   Diverticulitis [K57.92] Generalized abdominal pain [R10.84] Nausea vomiting and diarrhea [R11.2, R19.7]  DISCHARGE DIAGNOSIS:   Active Problems:   Acute diverticulitis   SECONDARY DIAGNOSIS:   Past Medical History:  Diagnosis Date  . Hyperlipidemia   . Reflex sympathetic dystrophy     HOSPITAL COURSE:   KatherineTerwilligeris a72 y.o.femalewith a known history of hyperlipidemia and reflex sympathetic dystrophy presents to hospital secondary to worsening abdominal pain, nausea and vomiting.  1. Sepsis- secondary to acute diverticulitis. -C. Difficile is negative, GI panel is negative, cultures pending. Continue Imodium as needed- however diarrhea resolved - added probiotics - improved wbc. -CT of the abdomen on admission negative for any abscess or perforation, showed diverticulitis -on Cipro and Flagyl  - tolerating regular diet  2.leukocytosis-secondary to above. Improved on antibiotics  3. RDS- pain meds prn  4. Perineal pain-secondary to skin breakdown from incontinence and also recent diarrhea.   -Barrier ointment twice a day. Repeat urine analysis with no significant infection. -Belladonna suppositories ordered for bladder spasms with improvement  Discharge today    DISCHARGE CONDITIONS:   Guarded  CONSULTS OBTAINED:   None  DRUG ALLERGIES:   Allergies  Allergen Reactions  . Levofloxacin Other (See Comments)    Reaction: Chest pain and "felt like her heart was going to stop"   DISCHARGE MEDICATIONS:   Allergies as of 11/11/2017      Reactions   Levofloxacin Other (See Comments)   Reaction: Chest pain and "felt  like her heart was going to stop"      Medication List    TAKE these medications   acidophilus Caps capsule Take 1 capsule by mouth daily for 10 days. Start taking on:  11/12/2017   ciprofloxacin 500 MG tablet Commonly known as:  CIPRO Take 1 tablet (500 mg total) by mouth 2 (two) times daily for 7 days.   metroNIDAZOLE 500 MG tablet Commonly known as:  FLAGYL Take 1 tablet (500 mg total) by mouth every 8 (eight) hours for 7 days.   opium-belladonna 16.2-60 MG suppository Commonly known as:  B&O SUPPRETTES Place 1 suppository rectally every 8 (eight) hours as needed for bladder spasms.   oxyCODONE 5 MG immediate release tablet Commonly known as:  Oxy IR/ROXICODONE Take 1 tablet (5 mg total) by mouth every 6 (six) hours as needed for moderate pain or severe pain.        DISCHARGE INSTRUCTIONS:   1. PCP f/u in 1-2 weeks  DIET:   Regular diet  ACTIVITY:   Activity as tolerated  OXYGEN:   Home Oxygen: No  Oxygen Delivery: Room air  DISCHARGE LOCATION:   Home  If you experience worsening of your admission symptoms, develop shortness of breath, life threatening emergency, suicidal or homicidal thoughts you must seek medical attention immediately by calling 911 or calling your MD immediately  if symptoms less severe.  You Must read complete instructions/literature along with all the possible adverse reactions/side effects for all the Medicines you take and that have been prescribed to you. Take any new Medicines after you have completely understood and accpet all the possible adverse reactions/side effects.   Please note  You  were cared for by a hospitalist during your hospital stay. If you have any questions about your discharge medications or the care you received while you were in the hospital after you are discharged, you can call the unit and asked to speak with the hospitalist on call if the hospitalist that took care of you is not available. Once you are  discharged, your primary care physician will handle any further medical issues. Please note that NO REFILLS for any discharge medications will be authorized once you are discharged, as it is imperative that you return to your primary care physician (or establish a relationship with a primary care physician if you do not have one) for your aftercare needs so that they can reassess your need for medications and monitor your lab values.    On the day of Discharge:  VITAL SIGNS:   Blood pressure 126/65, pulse 95, temperature 98.4 F (36.9 C), temperature source Oral, resp. rate 18, height 5\' 4"  (1.626 m), weight 68 kg (150 lb), SpO2 93 %.  PHYSICAL EXAMINATION:    GENERAL:72 y.o.-year-old patient lying in the bed with no acute distress.  EYES: Pupils equal, round, reactive to light and accommodation. No scleral icterus. Extraocular muscles intact.  HEENT: Head atraumatic, normocephalic. Oropharynx and nasopharynx clear.  NECK: Supple, no jugular venous distention. No thyroid enlargement, no tenderness.  LUNGS: Normal breath sounds bilaterally, no wheezing, rales,rhonchi or crepitation. No use of accessory muscles of respiration.  CARDIOVASCULAR: S1, S2 normal. No murmurs, rubs, or gallops.  ABDOMEN:non tender, soft, nondistended. Bowel sounds present. No organomegaly or mass.  Genitourinary-some erythema and tenderness on labial folds and also in the perineum EXTREMITIES: No pedal edema, cyanosis, or clubbing.  NEUROLOGIC: Cranial nerves II through XII are intact. Muscle strength 5/5 in all extremities. Sensation intact. Gait not checked.  PSYCHIATRIC: The patient is alert and oriented x 3.  SKIN: No obvious rash, lesion, or ulcer.   DATA REVIEW:   CBC Recent Labs  Lab 11/10/17 0418  WBC 11.4*  HGB 12.6  HCT 36.3  PLT 228    Chemistries  Recent Labs  Lab 11/08/17 0346 11/09/17 0649  NA 138 138  K 3.7 3.5  CL 101 105  CO2 22 24  GLUCOSE 128* 148*  BUN 16 8  CREATININE  0.78 0.74  CALCIUM 9.3 8.0*  AST 29  --   ALT 23  --   ALKPHOS 80  --   BILITOT 0.8  --      Microbiology Results  Results for orders placed or performed during the hospital encounter of 11/08/17  C difficile quick scan w PCR reflex     Status: None   Collection Time: 11/09/17 12:20 PM  Result Value Ref Range Status   C Diff antigen NEGATIVE NEGATIVE Final   C Diff toxin NEGATIVE NEGATIVE Final   C Diff interpretation No C. difficile detected.  Final    Comment: Performed at [facility], [address] [place], [place] [address]    RADIOLOGY:  No results found.   Management plans discussed with the patient, family and they are in agreement.  CODE STATUS:     Code Status Orders  (From admission, onward)        Start     Ordered   11/08/17 0937  Full code  Continuous     11/08/17 0936    Code Status History    Date Active Date Inactive Code Status Order ID Comments User Context   This  patient has a current code status but no historical code status.      TOTAL TIME TAKING CARE OF THIS PATIENT: 38  minutes.    [name] M.D on 11/11/2017 at 10:49 AM  Between 7am to 6pm - Pager - [phone]  After 6pm go to [email or web address] - Social research officer, government  [facility] [place] [place] [facility]  Office  [phone]  CC: Primary care physician; Patient, No Pcp Per   Note: This dictation was prepared with Dragon dictation along with smaller phrase technology. Any transcriptional errors that result from this process are unintentional.

## 2017-11-11 NOTE — Progress Notes (Signed)
Patient discharge teaching given, including activity, diet, follow-up appoints, and medications. Patient verbalized understanding of all discharge instructions. IV access was d/c'd. Vitals are stable. Skin is intact except as charted in most recent assessments. Pt to be escorted out by NT, to be driven home by family.  Sonia Ochoa  

## 2017-11-20 LAB — GASTROINTESTINAL PANEL BY PCR, STOOL (REPLACES STOOL CULTURE)

## 2020-03-24 ENCOUNTER — Inpatient Hospital Stay: Payer: Medicare Other | Admitting: Anesthesiology

## 2020-03-24 ENCOUNTER — Other Ambulatory Visit: Payer: Self-pay

## 2020-03-24 ENCOUNTER — Inpatient Hospital Stay
Admission: EM | Admit: 2020-03-24 | Discharge: 2020-04-08 | DRG: 339 | Disposition: A | Discharge status: Home Health | Payer: Medicare Other | Attending: General Surgery | Admitting: General Surgery

## 2020-03-24 ENCOUNTER — Encounter
Admission: EM | Disposition: A | Discharge status: Home Health | Payer: Self-pay | Source: Home / Self Care | Attending: General Surgery

## 2020-03-24 ENCOUNTER — Emergency Department: Payer: Medicare Other

## 2020-03-24 DIAGNOSIS — K3521 Acute appendicitis with generalized peritonitis, with abscess: Secondary | ICD-10-CM | POA: Diagnosis present

## 2020-03-24 DIAGNOSIS — K352 Acute appendicitis with generalized peritonitis, without abscess: Secondary | ICD-10-CM | POA: Diagnosis present

## 2020-03-24 DIAGNOSIS — Z978 Presence of other specified devices: Secondary | ICD-10-CM

## 2020-03-24 DIAGNOSIS — G9050 Complex regional pain syndrome I, unspecified: Secondary | ICD-10-CM | POA: Diagnosis present

## 2020-03-24 DIAGNOSIS — R109 Unspecified abdominal pain: Secondary | ICD-10-CM

## 2020-03-24 DIAGNOSIS — Z833 Family history of diabetes mellitus: Secondary | ICD-10-CM | POA: Diagnosis not present

## 2020-03-24 DIAGNOSIS — Z8249 Family history of ischemic heart disease and other diseases of the circulatory system: Secondary | ICD-10-CM | POA: Diagnosis not present

## 2020-03-24 DIAGNOSIS — E785 Hyperlipidemia, unspecified: Secondary | ICD-10-CM | POA: Diagnosis present

## 2020-03-24 DIAGNOSIS — Z807 Family history of other malignant neoplasms of lymphoid, hematopoietic and related tissues: Secondary | ICD-10-CM

## 2020-03-24 DIAGNOSIS — K219 Gastro-esophageal reflux disease without esophagitis: Secondary | ICD-10-CM | POA: Diagnosis present

## 2020-03-24 DIAGNOSIS — K3532 Acute appendicitis with perforation and localized peritonitis, without abscess: Secondary | ICD-10-CM

## 2020-03-24 DIAGNOSIS — Z20822 Contact with and (suspected) exposure to covid-19: Secondary | ICD-10-CM | POA: Diagnosis present

## 2020-03-24 DIAGNOSIS — Z5331 Laparoscopic surgical procedure converted to open procedure: Secondary | ICD-10-CM

## 2020-03-24 HISTORY — PX: APPENDECTOMY: SHX54

## 2020-03-24 HISTORY — DX: Diverticulitis of intestine, part unspecified, without perforation or abscess without bleeding: K57.92

## 2020-03-24 LAB — CBC
HCT: 33.2 % — ABNORMAL LOW (ref 36.0–46.0)
Hemoglobin: 11.8 g/dL — ABNORMAL LOW (ref 12.0–15.0)
MCH: 32.4 pg (ref 26.0–34.0)
MCHC: 35.5 g/dL (ref 30.0–36.0)
MCV: 91.2 fL (ref 80.0–100.0)
Platelets: 250 10*3/uL (ref 150–400)
RBC: 3.64 MIL/uL — ABNORMAL LOW (ref 3.87–5.11)
RDW: 13.2 % (ref 11.5–15.5)
WBC: 6.6 10*3/uL (ref 4.0–10.5)
nRBC: 0 % (ref 0.0–0.2)

## 2020-03-24 LAB — COMPREHENSIVE METABOLIC PANEL
ALT: 13 U/L (ref 0–44)
AST: 19 U/L (ref 15–41)
Albumin: 3.2 g/dL — ABNORMAL LOW (ref 3.5–5.0)
Alkaline Phosphatase: 55 U/L (ref 38–126)
Anion gap: 15 (ref 5–15)
BUN: 20 mg/dL (ref 8–23)
CO2: 26 mmol/L (ref 22–32)
Calcium: 8.3 mg/dL — ABNORMAL LOW (ref 8.9–10.3)
Chloride: 94 mmol/L — ABNORMAL LOW (ref 98–111)
Creatinine, Ser: 1.69 mg/dL — ABNORMAL HIGH (ref 0.44–1.00)
GFR calc Af Amer: 34 mL/min — ABNORMAL LOW (ref >60)
GFR calc non Af Amer: 29 mL/min — ABNORMAL LOW (ref >60)
Glucose, Bld: 117 mg/dL — ABNORMAL HIGH (ref 70–99)
Potassium: 3.4 mmol/L — ABNORMAL LOW (ref 3.5–5.1)
Sodium: 135 mmol/L (ref 135–145)
Total Bilirubin: 1.2 mg/dL (ref 0.3–1.2)
Total Protein: 6.2 g/dL — ABNORMAL LOW (ref 6.5–8.1)

## 2020-03-24 LAB — LIPASE, BLOOD: Lipase: 17 U/L (ref 11–51)

## 2020-03-24 LAB — SARS CORONAVIRUS 2 BY RT PCR (HOSPITAL ORDER, PERFORMED IN ~~LOC~~ HOSPITAL LAB): SARS Coronavirus 2: NEGATIVE

## 2020-03-24 LAB — TROPONIN I (HIGH SENSITIVITY): Troponin I (High Sensitivity): 6 ng/L (ref <18)

## 2020-03-24 SURGERY — APPENDECTOMY
Anesthesia: General

## 2020-03-24 MED ORDER — ENOXAPARIN SODIUM 40 MG/0.4ML ~~LOC~~ SOLN
40.0000 mg | SUBCUTANEOUS | Status: DC
  Administered 2020-03-25 – 2020-04-08 (×14): 40 mg via SUBCUTANEOUS
  Filled 2020-03-24 (×14): qty 0.4

## 2020-03-24 MED ORDER — ACETAMINOPHEN 650 MG RE SUPP: 650.0000 mg | Freq: Four times a day (QID) | RECTAL | Status: DC | PRN

## 2020-03-24 MED ORDER — FENTANYL CITRATE (PF) 100 MCG/2ML IJ SOLN
INTRAMUSCULAR | Status: DC | PRN
  Administered 2020-03-24 (×2): 50 ug via INTRAVENOUS

## 2020-03-24 MED ORDER — PIPERACILLIN-TAZOBACTAM 3.375 G IVPB
3.3750 g | Freq: Three times a day (TID) | INTRAVENOUS | Status: DC
  Administered 2020-03-25 – 2020-04-08 (×41): 3.375 g via INTRAVENOUS
  Filled 2020-03-24 (×42): qty 50

## 2020-03-24 MED ORDER — MORPHINE SULFATE (PF) 4 MG/ML IV SOLN
4.0000 mg | INTRAVENOUS | Status: DC | PRN
  Administered 2020-03-25 – 2020-04-02 (×12): 4 mg via INTRAVENOUS
  Filled 2020-03-24 (×12): qty 1

## 2020-03-24 MED ORDER — SODIUM CHLORIDE 0.9 % IV SOLN
INTRAVENOUS | Status: DC | PRN
  Administered 2020-03-24: 70 mL

## 2020-03-24 MED ORDER — ONDANSETRON HCL 4 MG/2ML IJ SOLN: 4.0000 mg | Freq: Once | INTRAMUSCULAR | Status: AC

## 2020-03-24 MED ORDER — ROCURONIUM BROMIDE 100 MG/10ML IV SOLN
INTRAVENOUS | Status: DC | PRN
  Administered 2020-03-24: 30 mg via INTRAVENOUS

## 2020-03-24 MED ORDER — SUGAMMADEX SODIUM 500 MG/5ML IV SOLN
INTRAVENOUS | Status: DC | PRN
  Administered 2020-03-24: 200 mg via INTRAVENOUS

## 2020-03-24 MED ORDER — MORPHINE SULFATE (PF) 4 MG/ML IV SOLN
4.0000 mg | Freq: Once | INTRAVENOUS | Status: AC
  Administered 2020-03-24: 4 mg via INTRAVENOUS
  Filled 2020-03-24: qty 1

## 2020-03-24 MED ORDER — FENTANYL CITRATE (PF) 100 MCG/2ML IJ SOLN
INTRAMUSCULAR | Status: AC
  Filled 2020-03-24: qty 2

## 2020-03-24 MED ORDER — SUCCINYLCHOLINE CHLORIDE 20 MG/ML IJ SOLN
INTRAMUSCULAR | Status: DC | PRN
  Administered 2020-03-24: 100 mg via INTRAVENOUS

## 2020-03-24 MED ORDER — PROPOFOL 10 MG/ML IV BOLUS
INTRAVENOUS | Status: DC | PRN
  Administered 2020-03-24: 160 mg via INTRAVENOUS

## 2020-03-24 MED ORDER — PHENYLEPHRINE HCL (PRESSORS) 10 MG/ML IV SOLN
INTRAVENOUS | Status: DC | PRN
  Administered 2020-03-24: 50 ug via INTRAVENOUS
  Administered 2020-03-24: 100 ug via INTRAVENOUS
  Administered 2020-03-24: 50 ug via INTRAVENOUS
  Administered 2020-03-24: 100 ug via INTRAVENOUS

## 2020-03-24 MED ORDER — PANTOPRAZOLE SODIUM 40 MG IV SOLR
40.0000 mg | Freq: Every day | INTRAVENOUS | Status: DC
  Administered 2020-03-25 – 2020-03-27 (×3): 40 mg via INTRAVENOUS
  Filled 2020-03-24 (×3): qty 40

## 2020-03-24 MED ORDER — LIDOCAINE HCL (CARDIAC) PF 100 MG/5ML IV SOSY
PREFILLED_SYRINGE | INTRAVENOUS | Status: DC | PRN
  Administered 2020-03-24: 100 mg via INTRAVENOUS

## 2020-03-24 MED ORDER — LACTATED RINGERS IV SOLN: INTRAVENOUS | Status: DC | PRN

## 2020-03-24 MED ORDER — BUPIVACAINE LIPOSOME 1.3 % IJ SUSP
INTRAMUSCULAR | Status: AC
  Filled 2020-03-24: qty 20

## 2020-03-24 MED ORDER — HYDROCODONE-ACETAMINOPHEN 5-325 MG PO TABS
1.0000 | ORAL_TABLET | ORAL | Status: DC | PRN
  Administered 2020-03-25 – 2020-04-03 (×17): 2 via ORAL
  Administered 2020-04-04: 1 via ORAL
  Administered 2020-04-06 – 2020-04-07 (×3): 2 via ORAL
  Filled 2020-03-24 (×14): qty 2
  Filled 2020-03-24: qty 1
  Filled 2020-03-24: qty 2
  Filled 2020-03-24: qty 1
  Filled 2020-03-24 (×5): qty 2

## 2020-03-24 MED ORDER — ONDANSETRON HCL 4 MG/2ML IJ SOLN
INTRAMUSCULAR | Status: AC
  Administered 2020-03-24: 4 mg via INTRAVENOUS
  Filled 2020-03-24: qty 2

## 2020-03-24 MED ORDER — ONDANSETRON 4 MG PO TBDP: 4.0000 mg | ORAL_TABLET | Freq: Four times a day (QID) | ORAL | Status: DC | PRN

## 2020-03-24 MED ORDER — BUPIVACAINE-EPINEPHRINE 0.5% -1:200000 IJ SOLN
INTRAMUSCULAR | Status: DC | PRN
  Administered 2020-03-24: 30 mL

## 2020-03-24 MED ORDER — ACETAMINOPHEN 325 MG PO TABS
650.0000 mg | ORAL_TABLET | Freq: Four times a day (QID) | ORAL | Status: DC | PRN
  Administered 2020-03-29: 650 mg via ORAL
  Filled 2020-03-24: qty 2

## 2020-03-24 MED ORDER — ONDANSETRON HCL 4 MG/2ML IJ SOLN
4.0000 mg | Freq: Four times a day (QID) | INTRAMUSCULAR | Status: DC | PRN
  Administered 2020-03-24 – 2020-03-30 (×4): 4 mg via INTRAVENOUS
  Filled 2020-03-24 (×3): qty 2

## 2020-03-24 MED ORDER — PIPERACILLIN-TAZOBACTAM 3.375 G IVPB 30 MIN
3.3750 g | Freq: Once | INTRAVENOUS | Status: AC
  Administered 2020-03-24: 3.375 g via INTRAVENOUS
  Filled 2020-03-24: qty 50

## 2020-03-24 MED ORDER — SODIUM CHLORIDE 0.9% FLUSH: 3.0000 mL | Freq: Once | INTRAVENOUS | Status: DC

## 2020-03-24 MED ORDER — ACETAMINOPHEN 10 MG/ML IV SOLN
INTRAVENOUS | Status: DC | PRN
  Administered 2020-03-24: 1000 mg via INTRAVENOUS

## 2020-03-24 MED ORDER — SODIUM CHLORIDE 0.9 % IV BOLUS
1000.0000 mL | Freq: Once | INTRAVENOUS | Status: AC
  Administered 2020-03-24: 1000 mL via INTRAVENOUS

## 2020-03-24 MED ORDER — BUPIVACAINE-EPINEPHRINE (PF) 0.5% -1:200000 IJ SOLN
INTRAMUSCULAR | Status: AC
  Filled 2020-03-24: qty 30

## 2020-03-24 MED ORDER — KETOROLAC TROMETHAMINE 30 MG/ML IJ SOLN
INTRAMUSCULAR | Status: DC | PRN
  Administered 2020-03-24: 30 mg via INTRAVENOUS

## 2020-03-24 MED ORDER — SODIUM CHLORIDE 0.9 % IV SOLN: INTRAVENOUS | Status: DC

## 2020-03-24 MED ORDER — ACETAMINOPHEN 10 MG/ML IV SOLN
INTRAVENOUS | Status: AC
  Filled 2020-03-24: qty 100

## 2020-03-24 MED ORDER — SODIUM CHLORIDE FLUSH 0.9 % IV SOLN
INTRAVENOUS | Status: AC
  Filled 2020-03-24: qty 50

## 2020-03-24 SURGICAL SUPPLY — 49 items
APPLIER CLIP LOGIC TI 5 (MISCELLANEOUS) IMPLANT
BLADE SURG SZ11 CARB STEEL (BLADE) ×4 IMPLANT
BULB RESERV EVAC DRAIN JP 100C (MISCELLANEOUS) ×4 IMPLANT
CANISTER SUCT 1200ML W/VALVE (MISCELLANEOUS) ×4 IMPLANT
CHLORAPREP W/TINT 26 (MISCELLANEOUS) ×4 IMPLANT
COVER WAND RF STERILE (DRAPES) ×4 IMPLANT
CUTTER FLEX LINEAR 45M (STAPLE) ×4 IMPLANT
DERMABOND ADVANCED (GAUZE/BANDAGES/DRESSINGS) ×2
DERMABOND ADVANCED .7 DNX12 (GAUZE/BANDAGES/DRESSINGS) ×2 IMPLANT
DRAIN CHANNEL JP 15F RND 16 (MISCELLANEOUS) ×4 IMPLANT
ELECT BLADE 6.5 EXT (BLADE) ×4 IMPLANT
ELECT REM PT RETURN 9FT ADLT (ELECTROSURGICAL) ×4
ELECTRODE REM PT RTRN 9FT ADLT (ELECTROSURGICAL) ×2 IMPLANT
GLOVE BIO SURGEON STRL SZ 6.5 (GLOVE) ×3 IMPLANT
GLOVE BIO SURGEONS STRL SZ 6.5 (GLOVE) ×1
GLOVE BIOGEL PI IND STRL 6.5 (GLOVE) ×2 IMPLANT
GLOVE BIOGEL PI INDICATOR 6.5 (GLOVE) ×2
GOWN STRL REUS W/ TWL LRG LVL3 (GOWN DISPOSABLE) ×6 IMPLANT
GOWN STRL REUS W/TWL LRG LVL3 (GOWN DISPOSABLE) ×6
GRADUATE 1200CC STRL 31836 (MISCELLANEOUS) ×4 IMPLANT
GRASPER SUT TROCAR 14GX15 (MISCELLANEOUS) ×4 IMPLANT
HANDLE YANKAUER SUCT BULB TIP (MISCELLANEOUS) ×4 IMPLANT
IRRIGATION STRYKERFLOW (MISCELLANEOUS) ×2 IMPLANT
IRRIGATOR STRYKERFLOW (MISCELLANEOUS) ×4
IV NS 1000ML (IV SOLUTION) ×2
IV NS 1000ML BAXH (IV SOLUTION) ×2 IMPLANT
KIT PREVENA INCISION MGT20CM45 (CANNISTER) ×4 IMPLANT
KIT TURNOVER KIT A (KITS) ×4 IMPLANT
LIGASURE LAP MARYLAND 5MM 37CM (ELECTROSURGICAL) ×4 IMPLANT
NEEDLE HYPO 22GX1.5 SAFETY (NEEDLE) ×4 IMPLANT
NEEDLE INSUFFLATION 14GA 120MM (NEEDLE) ×4 IMPLANT
NS IRRIG 500ML POUR BTL (IV SOLUTION) ×4 IMPLANT
PACK LAP CHOLECYSTECTOMY (MISCELLANEOUS) ×4 IMPLANT
POUCH ENDO CATCH 10MM SPEC (MISCELLANEOUS) ×4 IMPLANT
RELOAD 45 VASCULAR/THIN (ENDOMECHANICALS) IMPLANT
RELOAD STAPLE TA45 3.5 REG BLU (ENDOMECHANICALS) ×4 IMPLANT
RETRACTOR WND ALEXIS-O 25 LRG (MISCELLANEOUS) ×2 IMPLANT
RTRCTR WOUND ALEXIS O 25CM LRG (MISCELLANEOUS) ×4
SCISSORS METZENBAUM CVD 33 (INSTRUMENTS) ×4 IMPLANT
SET TUBE SMOKE EVAC HIGH FLOW (TUBING) ×4 IMPLANT
SLEEVE ENDOPATH XCEL 5M (ENDOMECHANICALS) ×4 IMPLANT
SPONGE GAUZE 2X2 8PLY STER LF (GAUZE/BANDAGES/DRESSINGS) ×1
SPONGE GAUZE 2X2 8PLY STRL LF (GAUZE/BANDAGES/DRESSINGS) ×3 IMPLANT
SPONGE LAP 18X18 RF (DISPOSABLE) ×4 IMPLANT
SUT MNCRL AB 4-0 PS2 18 (SUTURE) ×4 IMPLANT
SUT VICRYL PLUS ABS 0 54 (SUTURE) ×4 IMPLANT
TRAY FOLEY MTR SLVR 16FR STAT (SET/KITS/TRAYS/PACK) ×4 IMPLANT
TROCAR XCEL 12X100 BLDLESS (ENDOMECHANICALS) ×4 IMPLANT
TROCAR XCEL NON-BLD 5MMX100MML (ENDOMECHANICALS) ×4 IMPLANT

## 2020-03-24 NOTE — ED Provider Notes (Signed)
[facility] Emergency Department Provider Note    ____________________________________________   I have reviewed the triage vital signs and the nursing notes.   HISTORY  Chief Complaint Abdominal Pain   History limited by: Not Limited   HPI Sonia Ochoa is a 74 y.o. female who presents to the emergency department today because of concerns for abdominal pain.  It started 4 days ago.  It has gradually been getting worse.  Patient states that it is all throughout her stomach however it is slightly worse in the right side and suprapubic region.  Patient has had associated nausea.  She has noticed some diarrhea although this has been nonbloody.  Patient denies any fevers.  Patient has a history of diverticulitis.  States that today's pain does not quite remind her of the pain she had with her previous episode of diverticulitis.  Patient did eat some soup before the pain started and thinks that he might of had tomato seeds in it which might of triggered this episode.   Records reviewed. Per medical record review patient has a history of hospitalization for diverticulitis.  Past Medical History:  Diagnosis Date  . Diverticulitis   . Hyperlipidemia   . Reflex sympathetic dystrophy     Patient Active Problem List   Diagnosis Date Noted  . Acute diverticulitis 11/08/2017    History reviewed. No pertinent surgical history.  Prior to Admission medications   Medication Sig Start Date End Date Taking? Authorizing Provider  opium-belladonna (B&O SUPPRETTES) 16.2-60 MG suppository Place 1 suppository rectally every 8 (eight) hours as needed for bladder spasms. 11/11/17   Gladstone Lighter, MD  oxyCODONE (OXY IR/ROXICODONE) 5 MG immediate release tablet Take 1 tablet (5 mg total) by mouth every 6 (six) hours as needed for moderate pain or severe pain. 11/11/17   Gladstone Lighter, MD    Allergies Levofloxacin  Family History  Problem Relation Age of  Onset  . Multiple myeloma Father   . CAD Father   . Diabetes Sister     Social History Social History   Tobacco Use  . Smoking status: Never Smoker  . Smokeless tobacco: Never Used  Substance Use Topics  . Alcohol use: Yes    Comment: social drinking  . Drug use: No    Review of Systems Constitutional: No fever/chills Eyes: No visual changes. ENT: No sore throat. Cardiovascular: Denies chest pain. Respiratory: Denies shortness of breath. Gastrointestinal: Positive for abdominal pain, nausea. Positive for diarrhea.   Genitourinary: Negative for dysuria. Musculoskeletal: Negative for back pain. Skin: Negative for rash. Neurological: Negative for headaches, focal weakness or numbness.  ____________________________________________   PHYSICAL EXAM:  VITAL SIGNS: ED Triage Vitals  Enc Vitals Group     BP 03/24/20 1828 (!) 107/61     Pulse Rate 03/24/20 1828 98     Resp 03/24/20 1828 14     Temp 03/24/20 1828 98.5 F (36.9 C)     Temp Source 03/24/20 1828 Oral     SpO2 03/24/20 1828 96 %     Weight --      Height --      Head Circumference --      Peak Flow --      Pain Score 03/24/20 1829 10   Constitutional: Alert and oriented.  Eyes: Conjunctivae are normal.  ENT      Head: Normocephalic and atraumatic.      Nose: No congestion/rhinnorhea.      Mouth/Throat: Mucous membranes are moist.  Neck: No stridor. Hematological/Lymphatic/Immunilogical: No cervical lymphadenopathy. Cardiovascular: Normal rate, regular rhythm.  No murmurs, rubs, or gallops. Respiratory: Normal respiratory effort without tachypnea nor retractions. Breath sounds are clear and equal bilaterally. No wheezes/rales/rhonchi. Gastrointestinal: Soft. Diffusely tender to palpation throughout the abdomen. Perhaps slightly worse on the rights side.  Genitourinary: Deferred Musculoskeletal: Normal range of motion in all extremities. No lower extremity edema. Neurologic:  Normal speech and  language. No gross focal neurologic deficits are appreciated.  Skin:  Skin is warm, dry and intact. No rash noted. Psychiatric: Mood and affect are normal. Speech and behavior are normal. Patient exhibits appropriate insight and judgment.  ____________________________________________    LABS (pertinent positives/negatives)  CBC wbc 6.6, hgb 11.8, plt 250 Trop hs 6 Lipase 17 CMP na 135, k 3.4, glu 117, cr 1.69 ____________________________________________   EKG  I, Nance Pear, attending physician, personally viewed and interpreted this EKG  EKG Time: 1830 Rate: 99 Rhythm: sinus rhythm Axis: normal Intervals: qtc 502 QRS: narrow, q waves v1 ST changes: no st elevation Impression: abnormal ekg ____________________________________________    RADIOLOGY  CT ab/pel Findings consistent with perforated appendicitis  I, Nance Pear, personally discussed these images and results by phone with the on-call radiologist and used this discussion as part of my medical decision making.   ____________________________________________   PROCEDURES  Procedures  CRITICAL CARE Performed by: Nance Pear   Total critical care time: 35 minutes  Critical care time was exclusive of separately billable procedures and treating other patients.  Critical care was necessary to treat or prevent imminent or life-threatening deterioration.  Critical care was time spent personally by me on the following activities: development of treatment plan with patient and/or surrogate as well as nursing, discussions with consultants, evaluation of patient's response to treatment, examination of patient, obtaining history from patient or surrogate, ordering and performing treatments and interventions, ordering and review of laboratory studies, ordering and review of radiographic studies, pulse oximetry and re-evaluation of patient's  condition.  ____________________________________________   INITIAL IMPRESSION / ASSESSMENT AND PLAN / ED COURSE  Pertinent labs & imaging results that were available during my care of the patient were reviewed by me and considered in my medical decision making (see chart for details).   Patient presented to the emergency department today because of concerns for abdominal pain.  On exam patient's abdomen is soft.  It is somewhat diffusely tender to palpation.  Blood work concerning leukocytosis.  Patient with out fever however given abdominal tenderness CT abdomen pelvis was performed.  This was consistent with perforated appendicitis.  Additionally patient was found to have slight AKI.  Patient was given IV fluids.  Will start IV antibiotics.  Discussed with Dr. Peyton Najjar with surgery.  Discussed findings and plan with patient.  ____________________________________________   FINAL CLINICAL IMPRESSION(S) / ED DIAGNOSES  Final diagnoses:  Perforated appendicitis  Abdominal pain, unspecified abdominal location     Note: This dictation was prepared with Dragon dictation. Any transcriptional errors that result from this process are unintentional     Nance Pear, MD 03/24/20 2107

## 2020-03-24 NOTE — Anesthesia Preprocedure Evaluation (Addendum)
Anesthesia Evaluation  Patient identified by MRN, date of birth, ID band Patient awake    Reviewed: Allergy & Precautions, NPO status , Patient's Chart, lab work & pertinent test results  History of Anesthesia Complications Negative for: history of anesthetic complications  Airway Mallampati: II       Dental   Pulmonary neg sleep apnea, neg COPD, Not current smoker,           Cardiovascular hypertension (pt takes no meds), (-) Past MI and (-) CHF (-) dysrhythmias (-) Valvular Problems/Murmurs     Neuro/Psych neg Seizures    GI/Hepatic Neg liver ROS, GERD  Poorly Controlled,  Endo/Other  neg diabetesHypothyroidism (pt takes no meds)   Renal/GU negative Renal ROS     Musculoskeletal   Abdominal   Peds  Hematology   Anesthesia Other Findings   Reproductive/Obstetrics                           Anesthesia Physical Anesthesia Plan  ASA: II and emergent  Anesthesia Plan: General   Post-op Pain Management:    Induction: Intravenous  PONV Risk Score and Plan: 3 and Ondansetron, Dexamethasone and Treatment may vary due to age or medical condition  Airway Management Planned: Oral ETT  Additional Equipment:   Intra-op Plan:   Post-operative Plan:   Informed Consent: I have reviewed the patients History and Physical, chart, labs and discussed the procedure including the risks, benefits and alternatives for the proposed anesthesia with the patient or authorized representative who has indicated his/her understanding and acceptance.       Plan Discussed with:   Anesthesia Plan Comments:         Anesthesia Quick Evaluation

## 2020-03-24 NOTE — Transfer of Care (Signed)
Immediate Anesthesia Transfer of Care Note  Patient: Sonia Ochoa  Procedure(s) Performed: APPENDECTOMY  Patient Location: PACU  Anesthesia Type:General  Level of Consciousness: awake and alert   Airway & Oxygen Therapy: Patient Spontanous Breathing  Post-op Assessment: Report given to RN  Post vital signs: Reviewed and stable  Last Vitals:  Vitals Value Taken Time  BP    Temp    Pulse    Resp    SpO2      Last Pain:  Vitals:   03/24/20 1829  TempSrc:   PainSc: 10-Worst pain ever         Complications: No complications documented.

## 2020-03-24 NOTE — H&P (Addendum)
SURGICAL CONSULTATION NOTE   HISTORY OF PRESENT ILLNESS (HPI):  74 y.o. female presented to [facility] ED for evaluation of abdominal pain. Patient reports started having abdominal pain 4 days ago.  The complete history and physical exam and discussion of diagnosis and plan was done in presence of nurse April Brumgard. She reported that every day has been increasing and getting worse.  The pain initially localized to the right lower quadrant but now is generalized in the abdomen.  She denies any further radiation to other part of body.  Aggravating factor is moving her body.  There has been no alleviating factors.  Patient denies fever chills.  Patient denies any nausea or vomiting.  Patient report associated diarrhea.  Upon arrival to the ED patient had labs that shows no leukocytosis but elevated creatinine.  Patient already received 2 L of normal saline as per ED physician.  She has CT scan of the abdomen that shows very wide appendix with appendicolith and scattered free air around the abdomen.  I personally evaluated the images.  Surgery is consulted by Dr. Archie Balboa in this context for evaluation and management of acute perforated appendicitis.  PAST MEDICAL HISTORY (PMH):  Past Medical History:  Diagnosis Date  . Diverticulitis   . Hyperlipidemia   . Reflex sympathetic dystrophy      PAST SURGICAL HISTORY (Paia):  History reviewed. No pertinent surgical history.   MEDICATIONS:  Prior to Admission medications   Medication Sig Start Date End Date Taking? Authorizing Provider  opium-belladonna (B&O SUPPRETTES) 16.2-60 MG suppository Place 1 suppository rectally every 8 (eight) hours as needed for bladder spasms. 11/11/17   Gladstone Lighter, MD  oxyCODONE (OXY IR/ROXICODONE) 5 MG immediate release tablet Take 1 tablet (5 mg total) by mouth every 6 (six) hours as needed for moderate pain or severe pain. 11/11/17   Gladstone Lighter, MD     ALLERGIES:  Allergies  Allergen Reactions  .  Levofloxacin Other (See Comments)    Reaction: Chest pain and "felt like her heart was going to stop"     SOCIAL HISTORY:  Social History   Socioeconomic History  . Marital status: Married    Spouse name: Not on file  . Number of children: Not on file  . Years of education: Not on file  . Highest education level: Not on file  Occupational History  . Not on file  Tobacco Use  . Smoking status: Never Smoker  . Smokeless tobacco: Never Used  Substance and Sexual Activity  . Alcohol use: Yes    Comment: social drinking  . Drug use: No  . Sexual activity: Not on file  Other Topics Concern  . Not on file  Social History Narrative   Lives at home with family.   Has a scooter to get around the house.   Jehovah's Witness. Does not take blood products   Social Determinants of Health   Financial Resource Strain:   . Difficulty of Paying Living Expenses:   Food Insecurity:   . Worried About Charity fundraiser in the Last Year:   . Arboriculturist in the Last Year:   Transportation Needs:   . Film/video editor (Medical):   Marland Kitchen Lack of Transportation (Non-Medical):   Physical Activity:   . Days of Exercise per Week:   . Minutes of Exercise per Session:   Stress:   . Feeling of Stress :   Social Connections:   . Frequency of Communication with Friends and Family:   .  Frequency of Social Gatherings with Friends and Family:   . Attends Religious Services:   . Active Member of Clubs or Organizations:   . Attends Archivist Meetings:   Marland Kitchen Marital Status:   Intimate Partner Violence:   . Fear of Current or Ex-Partner:   . Emotionally Abused:   Marland Kitchen Physically Abused:   . Sexually Abused:       FAMILY HISTORY:  Family History  Problem Relation Age of Onset  . Multiple myeloma Father   . CAD Father   . Diabetes Sister      REVIEW OF SYSTEMS:  Constitutional: denies weight loss, fever, chills, or sweats  Eyes: denies any other vision changes, history of eye  injury  ENT: denies sore throat, hearing problems  Respiratory: denies shortness of breath, wheezing  Cardiovascular: denies chest pain, palpitations  Gastrointestinal: positive abdominal pain, denies nausea and vomiting Genitourinary: denies burning with urination or urinary frequency Musculoskeletal: denies any other joint pains or cramps  Skin: denies any other rashes or skin discolorations  Neurological: denies any other headache, dizziness, weakness  Psychiatric: denies any other depression, anxiety   All other review of systems were negative   VITAL SIGNS:  Temp:  [98.5 F (36.9 C)] 98.5 F (36.9 C) (07/24 1828) Pulse Rate:  [87-98] 88 (07/24 2100) Resp:  [14-21] 21 (07/24 2100) BP: (95-107)/(53-61) 102/60 (07/24 2100) SpO2:  [94 %-100 %] 98 % (07/24 2100)             INTAKE/OUTPUT:  This shift: Total I/O In: 1050 [IV Piggyback:1050] Out: -   Last 2 shifts: @IOLAST2SHIFTS @   PHYSICAL EXAM:  Constitutional:  -- Normal body habitus  -- Awake, alert, and oriented x3  Eyes:  -- Pupils equally round and reactive to light  -- No scleral icterus  Ear, nose, and throat:  -- No jugular venous distension  Pulmonary:  -- No crackles  -- Equal breath sounds bilaterally -- Breathing non-labored at rest Cardiovascular:  -- S1, S2 present  -- No pericardial rubs Gastrointestinal:  -- Abdomen hard, tender in all quadrants, non-distended, positive guarding and rebound tenderness -- No abdominal masses appreciated, pulsatile or otherwise  Musculoskeletal and Integumentary:  -- Wounds or skin discoloration: None appreciated -- Extremities: B/L UE and LE FROM, hands and feet warm, no edema  Neurologic:  -- Motor function: intact and symmetric -- Sensation: intact and symmetric   Labs:  CBC Latest Ref Rng & Units 03/24/2020 11/10/2017 11/09/2017  WBC 4.0 - 10.5 K/uL 6.6 11.4(H) 11.6(H)  Hemoglobin 12.0 - 15.0 g/dL 11.8(L) 12.6 13.3  Hematocrit 36 - 46 % 33.2(L) 36.3 39.4   Platelets 150 - 400 K/uL 250 228 247   CMP Latest Ref Rng & Units 03/24/2020 11/09/2017 11/08/2017  Glucose 70 - 99 mg/dL 117(H) 148(H) 128(H)  BUN 8 - 23 mg/dL 20 8 16   Creatinine 0.44 - 1.00 mg/dL 1.69(H) 0.74 0.78  Sodium 135 - 145 mmol/L 135 138 138  Potassium 3.5 - 5.1 mmol/L 3.4(L) 3.5 3.7  Chloride 98 - 111 mmol/L 94(L) 105 101  CO2 22 - 32 mmol/L 26 24 22   Calcium 8.9 - 10.3 mg/dL 8.3(L) 8.0(L) 9.3  Total Protein 6.5 - 8.1 g/dL 6.2(L) - 7.2  Total Bilirubin 0.3 - 1.2 mg/dL 1.2 - 0.8  Alkaline Phos 38 - 126 U/L 55 - 80  AST 15 - 41 U/L 19 - 29  ALT 0 - 44 U/L 13 - 23     Imaging studies:  EXAM: CT ABDOMEN AND PELVIS WITHOUT CONTRAST  TECHNIQUE: Multidetector CT imaging of the abdomen and pelvis was performed following the standard protocol without IV contrast.  COMPARISON:  11/08/2017  FINDINGS: Lower chest: Bibasilar scarring and or atelectasis. Trace right pleural effusion. Coronary artery calcifications.  Hepatobiliary: No solid liver abnormality is seen. No gallstones, gallbladder wall thickening, or biliary dilatation.  Pancreas: Unremarkable. No pancreatic ductal dilatation or surrounding inflammatory changes.  Spleen: Normal in size without significant abnormality.  Adrenals/Urinary Tract: Adrenal glands are unremarkable. Kidneys are normal, without renal calculi, solid lesion, or hydronephrosis. Bladder is unremarkable.  Stomach/Bowel: Stomach is within normal limits. The appendix is grossly distended, measuring up to 1.8 cm in caliber, with indistinct mucosa and a large appendicolith present proximally (series 2, image 71, 67). There is extensive adjacent inflammation. Sigmoid diverticulosis.  Vascular/Lymphatic: Aortic atherosclerosis. No enlarged abdominal or pelvic lymph nodes.  Reproductive: No mass or other significant abnormality.  Other: No abdominal wall hernia or abnormality. Small volume ascites throughout the abdomen and  pelvis. Small locules of intraperitoneal free air (series 2, image 40).  Musculoskeletal: No acute or significant osseous findings.  IMPRESSION: 1. The appendix is grossly distended, measuring up to 1.8 cm in caliber, with indistinct mucosa and a large appendicolith present proximally. There is extensive adjacent inflammation. Small locules of intraperitoneal free air. Findings are consistent with appendicitis complicated by perforation. 2. Small volume ascites throughout the abdomen and pelvis. 3. Trace right pleural effusion. 4. Coronary artery disease.  Aortic Atherosclerosis (ICD10-I70.0).  These results were called by telephone at the time of interpretation on 03/24/2020 at 8:28 pm to provider [facility] , who verbally acknowledged these results.   Electronically Signed   By: Eddie Candle M.D.   On: 03/24/2020 20:29  Assessment/Plan:  74 y.o. female with perforated appendicitis with peritonitis.  Patient with history, physical exam and images consistent with acute appendicitis.  Patient oriented about the severe nature of her perforated appendicitis with generalized peritonitis.  Patient oriented about diagnosis and surgical management as treatment.  Patient also oriented about the high risk of needing to convert to open appendectomy and possible need of resecting small and/or large intestine due to the amount of inflammation seen on the CT scan and being 4 days from onset of pain.  Patient oriented about goals of surgery and its risk including: bowel injury, infection, abscess, bleeding, leak from cecum, intestinal adhesions, bowel obstruction, fistula, injury to the ureter among others.  Patient understood and agreed to proceed with surgery.  Patient already received 2 L of normal saline due to the elevated creatinine most likely early acute kidney injury due to generalized infection.  Will admit patient, already started on antibiotic therapy, will continue aggressive IV  hydration since patient is NPO and schedule to OR.   Arnold Long, MD

## 2020-03-24 NOTE — ED Notes (Signed)
PT STATES SHE WANTS NO BLOOD PRODUCTS.

## 2020-03-24 NOTE — ED Notes (Signed)
Assisted MD with exam. 

## 2020-03-24 NOTE — ED Notes (Signed)
Assisted surgeon with exam

## 2020-03-24 NOTE — Op Note (Signed)
Preoperative diagnosis: Acute perforated appendicitis.  Postoperative diagnosis: Acute perforated appendicitis  Procedure: Laparoscopic converted to open Appendectomy.  Anesthesia: GETA  Surgeon: Dr. Hazle Quant, MD  Wound Classification: Contaminated  Indications: Patient is a 74 y.o. female  presented with generalized abdominal pain of 4 days of duration. Computed tomography scan and physical examination was consistent with acute perforated appendicitis.   Findings:  1. Acutely perforated appendicitis appendix with purulent and feculent peritonitis 2. Normal anatomy 3. Adequate hemostasis.   Description of procedure: The patient was placed in the supine position and general endotracheal anesthesia was induced. The abdomen was prepped and draped in the usual sterile fashion. A timeout was completed verifying correct patient, procedure, site, positioning, and implant(s) and/or special equipment prior to beginning this procedure.  A skin incision was made over the umbilical area.  Veress needle was inserted.  Proper placement was confirmed with saline test.  Abdominal cavity insufflated up to 15 mg Hg.  5 mm trocar was inserted using Optiview technique.  Upon insertion of the camera a wound amount of purulence was identified in all quadrants.  A lot of adhesions were finally in the lower abdomen and pelvis.  Due to the sedation I was unable to place any other trochars.  Decision was to proceed with open appendectomy.    A skin incision was made in a midline from umbilical area to the suprapubic area.  This was extended down to the midline fascia.  Midline fascia was opened and peritoneal cavity was accessed.  Abundant amount of purulent fluid was encountered and aspirated. The cecum was identified and pulled into the wound and held in place using a moist pad. It was necessary to incise the lateral peritoneal attachment of the cecum to mobilize the cecum and display the appendix. The appendix  was noted to be perforated.  The mesentery of the appendix was divided with LigaSure device. The base of the appendix was divided with linear stapler. The stump was cauterized and then invaginated into the cecum using forceps and the pursestring suture was tied in such a manner as to completely invert and cover the stump. Hemostasis was checked.  The abdominal cavity was then irrigated with 4 L or warm saline.  The small bowel was run and expected for any other site of perforation.  None was found.  Omentum was placed on the site of the operation.  A 15 French drain was placed covering the pelvis and the right gutter. The incision was closed approximating the anterior fascia with a running 0 PDS.  The skin was closed with staples.  A negative pressure dressing was applied to the wound. The patient tolerated the procedure well and was taken to the postanesthesia care unit in satisfactory condition.   Specimen: Appendix  Complications: None  Estimated Blood Loss: 50 mL

## 2020-03-24 NOTE — ED Notes (Signed)
Patient transported to CT 

## 2020-03-24 NOTE — ED Triage Notes (Signed)
Pt presents via EMS c/o abd pain x4 days. Reports hx diverticulitis.

## 2020-03-24 NOTE — Anesthesia Procedure Notes (Signed)
Procedure Name: Intubation Performed by: Lesle Reek, CRNA Pre-anesthesia Checklist: Timeout performed, Patient being monitored, Suction available, Emergency Drugs available and Patient identified Patient Re-evaluated:Patient Re-evaluated prior to induction Oxygen Delivery Method: Circle system utilized Preoxygenation: Pre-oxygenation with 100% oxygen Induction Type: IV induction Laryngoscope Size: Mac and 4 Tube type: Oral Tube size: 7.0 mm Airway Equipment and Method: Stylet Placement Confirmation: ETT inserted through vocal cords under direct vision,  positive ETCO2,  CO2 detector and breath sounds checked- equal and bilateral Secured at: 21 cm Tube secured with: Tape

## 2020-03-25 ENCOUNTER — Encounter: Payer: Self-pay | Admitting: General Surgery

## 2020-03-25 LAB — BASIC METABOLIC PANEL
Anion gap: 13 (ref 5–15)
BUN: 22 mg/dL (ref 8–23)
CO2: 27 mmol/L (ref 22–32)
Calcium: 7.6 mg/dL — ABNORMAL LOW (ref 8.9–10.3)
Chloride: 97 mmol/L — ABNORMAL LOW (ref 98–111)
Creatinine, Ser: 1.23 mg/dL — ABNORMAL HIGH (ref 0.44–1.00)
GFR calc Af Amer: 50 mL/min — ABNORMAL LOW (ref >60)
GFR calc non Af Amer: 43 mL/min — ABNORMAL LOW (ref >60)
Glucose, Bld: 116 mg/dL — ABNORMAL HIGH (ref 70–99)
Potassium: 3.5 mmol/L (ref 3.5–5.1)
Sodium: 137 mmol/L (ref 135–145)

## 2020-03-25 LAB — CBC
HCT: 31.6 % — ABNORMAL LOW (ref 36.0–46.0)
Hemoglobin: 10.9 g/dL — ABNORMAL LOW (ref 12.0–15.0)
MCH: 32 pg (ref 26.0–34.0)
MCHC: 34.5 g/dL (ref 30.0–36.0)
MCV: 92.7 fL (ref 80.0–100.0)
Platelets: 240 10*3/uL (ref 150–400)
RBC: 3.41 MIL/uL — ABNORMAL LOW (ref 3.87–5.11)
RDW: 13.2 % (ref 11.5–15.5)
WBC: 8.8 10*3/uL (ref 4.0–10.5)
nRBC: 0 % (ref 0.0–0.2)

## 2020-03-25 LAB — MAGNESIUM: Magnesium: 1.7 mg/dL (ref 1.7–2.4)

## 2020-03-25 MED ORDER — FAMOTIDINE IN NACL 20-0.9 MG/50ML-% IV SOLN
20.0000 mg | Freq: Two times a day (BID) | INTRAVENOUS | Status: AC
  Administered 2020-03-25 – 2020-03-28 (×7): 20 mg via INTRAVENOUS
  Filled 2020-03-25 (×7): qty 50

## 2020-03-25 MED ORDER — FENTANYL CITRATE (PF) 100 MCG/2ML IJ SOLN
25.0000 ug | INTRAMUSCULAR | Status: DC | PRN
  Administered 2020-03-25: 25 ug via INTRAVENOUS

## 2020-03-25 MED ORDER — ONDANSETRON HCL 4 MG/2ML IJ SOLN: 4.0000 mg | Freq: Once | INTRAMUSCULAR | Status: DC | PRN

## 2020-03-25 MED ORDER — CHLORHEXIDINE GLUCONATE CLOTH 2 % EX PADS: 6.0000 | MEDICATED_PAD | Freq: Every day | CUTANEOUS | Status: DC

## 2020-03-25 MED ORDER — FENTANYL CITRATE (PF) 100 MCG/2ML IJ SOLN
INTRAMUSCULAR | Status: AC
  Administered 2020-03-25: 25 ug via INTRAVENOUS
  Filled 2020-03-25: qty 2

## 2020-03-25 MED ORDER — ALUM & MAG HYDROXIDE-SIMETH 200-200-20 MG/5ML PO SUSP
30.0000 mL | Freq: Four times a day (QID) | ORAL | Status: DC | PRN
  Administered 2020-03-25 – 2020-04-08 (×3): 30 mL via ORAL
  Filled 2020-03-25 (×3): qty 30

## 2020-03-25 MED ORDER — PHENOL 1.4 % MT LIQD
1.0000 | OROMUCOSAL | Status: DC | PRN
  Administered 2020-03-25: 1 via OROMUCOSAL
  Filled 2020-03-25 (×2): qty 177

## 2020-03-25 MED ORDER — LACTATED RINGERS IV BOLUS
500.0000 mL | Freq: Once | INTRAVENOUS | Status: AC
  Administered 2020-03-25: 500 mL via INTRAVENOUS

## 2020-03-25 NOTE — Anesthesia Postprocedure Evaluation (Signed)
Anesthesia Post Note  Patient: Sonia Ochoa  Procedure(s) Performed: APPENDECTOMY  Patient location during evaluation: PACU Anesthesia Type: General Level of consciousness: awake and alert Pain management: pain level controlled Vital Signs Assessment: post-procedure vital signs reviewed and stable Respiratory status: spontaneous breathing and respiratory function stable Cardiovascular status: stable Anesthetic complications: no   No complications documented.   Last Vitals:  Vitals:   03/24/20 2130 03/24/20 2357  BP: (!) 102/63 103/65  Pulse: 92   Resp: 19 20  Temp:  36.7 C  SpO2: 96% 90%    Last Pain:  Vitals:   03/24/20 2357  TempSrc:   PainSc: 0-No pain                 Tarris Delbene K

## 2020-03-25 NOTE — Plan of Care (Signed)
Continuing with plan of care. 

## 2020-03-25 NOTE — Progress Notes (Signed)
SURGICAL PROGRESS NOTE   Hospital Day(s): 1.   Post op day(s): 1 Day Post-Op.   Interval History: Patient seen and examined, no acute events or new complaints overnight. Patient reports feeling uncomfortable due to heartburn.  Patient feels burning coming out of the abdomen to the chest.  Protonix worked for a little bit.  Patient asking for Pepcid.  Patient reports abdominal pain is better.  No nausea or vomiting.  Still not passing gas.  Vital signs in last 24 hours: [min-max] current  Temp:  [97.8 F (36.6 C)-98.5 F (36.9 C)] 98.5 F (36.9 C) (07/25 0208) Pulse Rate:  [81-100] 99 (07/25 0605) Resp:  [14-21] 18 (07/25 0309) BP: (85-119)/(45-65) 119/61 (07/25 0605) SpO2:  [90 %-100 %] 93 % (07/25 0605) Weight:  [69.7 kg] 69.7 kg (07/25 0208)     Height: 5\' 2"  (157.5 cm) Weight: 69.7 kg BMI (Calculated): 28.1   Physical Exam:  Constitutional: alert, cooperative and no distress  Respiratory: breathing non-labored at rest  Cardiovascular: regular rate and sinus rhythm  Gastrointestinal: soft, non-tender, and non-distended. Drain in place  Labs:  CBC Latest Ref Rng & Units 03/25/2020 03/24/2020 11/10/2017  WBC 4.0 - 10.5 K/uL 8.8 6.6 11.4(H)  Hemoglobin 12.0 - 15.0 g/dL 10.9(L) 11.8(L) 12.6  Hematocrit 36 - 46 % 31.6(L) 33.2(L) 36.3  Platelets 150 - 400 K/uL 240 250 228   CMP Latest Ref Rng & Units 03/25/2020 03/24/2020 11/09/2017  Glucose 70 - 99 mg/dL 01/09/2018) 093(O) 671(I)  BUN 8 - 23 mg/dL 22 20 8   Creatinine 0.44 - 1.00 mg/dL 458(K) ) 9.98(P  Sodium 135 - 145 mmol/L 137 135 138  Potassium 3.5 - 5.1 mmol/L 3.5 3.4(L) 3.5  Chloride 98 - 111 mmol/L 97(L) 94(L) 105  CO2 22 - 32 mmol/L 27 26 24   Calcium 8.9 - 10.3 mg/dL 7.6(L) 8.3(L) 8.0(L)  Total Protein 6.5 - 8.1 g/dL - 6.2(L) -  Total Bilirubin 0.3 - 1.2 mg/dL - 1.2 -  Alkaline Phos 38 - 126 U/L - 55 -  AST 15 - 41 U/L - 19 -  ALT 0 - 44 U/L - 13 -    Imaging studies: No new pertinent imaging  studies   Assessment/Plan:  74 y.o. female with acute perforated appendicitis 1 Day Post-Op s/p open appendectomy, complicated by pertinent comorbidities including GERD.  Patient tolerated surgery well.  This morning with adequate vital signs.  No fever or tachycardia.  Patient main complaint is heartburn.  I will add Pepcid to see if this makes her burning sensation better.  Otherwise we will continue with IV antibiotic therapy, DVT prophylaxis.  We will continue with drain in place.  I encouraged the patient to ambulate.  We will continue with clear liquids until bowel function returns.  0.53, MD

## 2020-03-26 MED ORDER — SODIUM CHLORIDE 0.9 % IV SOLN
INTRAVENOUS | Status: DC | PRN
  Administered 2020-03-26: 250 mL via INTRAVENOUS

## 2020-03-26 NOTE — Progress Notes (Signed)
SURGICAL PROGRESS NOTE   Hospital Day(s): 2.   Post op day(s): 2 Days Post-Op.   Interval History: Patient seen and examined, no acute events or new complaints overnight. Patient reports feeling better than yesterday.  She denies any nausea or vomiting.  She still having significant pain.  She reported that it feels like muscle strain.  Otherwise she does look more comfortable.  She reported that she is not walking because she needs help.  Vital signs in last 24 hours: [min-max] current  Temp:  [97.6 F (36.4 C)-98 F (36.7 C)] 98 F (36.7 C) (07/26 0506) Pulse Rate:  [93-97] 96 (07/26 0506) Resp:  [15-20] 20 (07/26 0506) BP: (99-120)/(58-69) 99/60 (07/26 0506) SpO2:  [91 %-96 %] 91 % (07/26 0506)     Height: 5\' 2"  (157.5 cm) Weight: 69.7 kg BMI (Calculated): 28.1   Physical Exam:  Constitutional: alert, cooperative and no distress  Respiratory: breathing non-labored at rest  Cardiovascular: regular rate and sinus rhythm  Gastrointestinal: soft, non-tender, and non-distended.  Drain in place  Labs:  CBC Latest Ref Rng & Units 03/25/2020 03/24/2020 11/10/2017  WBC 4.0 - 10.5 K/uL 8.8 6.6 11.4(H)  Hemoglobin 12.0 - 15.0 g/dL 10.9(L) 11.8(L) 12.6  Hematocrit 36 - 46 % 31.6(L) 33.2(L) 36.3  Platelets 150 - 400 K/uL 240 250 228   CMP Latest Ref Rng & Units 03/25/2020 03/24/2020 11/09/2017  Glucose 70 - 99 mg/dL 01/09/2018) 592(T) 244(Q)  BUN 8 - 23 mg/dL 22 20 8   Creatinine 0.44 - 1.00 mg/dL 286(N) ) 8.17(R  Sodium 135 - 145 mmol/L 137 135 138  Potassium 3.5 - 5.1 mmol/L 3.5 3.4(L) 3.5  Chloride 98 - 111 mmol/L 97(L) 94(L) 105  CO2 22 - 32 mmol/L 27 26 24   Calcium 8.9 - 10.3 mg/dL 7.6(L) 8.3(L) 8.0(L)  Total Protein 6.5 - 8.1 g/dL - 6.2(L) -  Total Bilirubin 0.3 - 1.2 mg/dL - 1.2 -  Alkaline Phos 38 - 126 U/L - 55 -  AST 15 - 41 U/L - 19 -  ALT 0 - 44 U/L - 13 -    Imaging studies: No new pertinent imaging studies   Assessment/Plan:  74 y.o. female with acute perforated  appendicitis 2 Day Post-Op s/p open appendectomy, complicated by pertinent comorbidities including GERD.  Patient recovering slowly but adequately.  There has been no fever or chills.  Pain slowly getting more controlled.  There has been no nausea or vomiting.  We will advance diet to full liquids.  We will continue with IV antibiotic therapy due to the severity of intra-abdominal infection.  Patient need at least 5 days with IV antibiotic therapy.  We will continue with negative pressure dressing in place.  I will consult PT for evaluation for physical therapy with the patient deconditioning.  I encouraged the patient to ambulate as tolerated.  7.90, MD

## 2020-03-27 LAB — SURGICAL PATHOLOGY

## 2020-03-27 NOTE — Care Management Important Message (Signed)
Important Message  Patient Details  Name: Sonia Ochoa MRN: [id number] Date of Birth: 02/12/1946   Medicare Important Message Given:  Yes     Johnell Comings 03/27/2020, 11:23 AM

## 2020-03-27 NOTE — Progress Notes (Signed)
SURGICAL PROGRESS NOTE   Hospital Day(s): 3.   Post op day(s): 3 Days Post-Op.   Interval History: Patient seen and examined, no acute events or new complaints overnight. Patient reports still having difficulty swallowing due to irritation of the throat.  She does not want more solid diet at this moment.  Reported the pain slowly being controlled with current pain medications.  Denies any nausea or vomiting.  Vital signs in last 24 hours: [min-max] current  Temp:  [97.8 F (36.6 C)-98.8 F (37.1 C)] 98.2 F (36.8 C) (07/27 1210) Pulse Rate:  [88-97] 97 (07/27 1210) Resp:  [18-20] 18 (07/27 1210) BP: (138-167)/(79-97) 138/79 (07/27 1210) SpO2:  [90 %-95 %] 90 % (07/27 1210)     Height: 5\' 2"  (157.5 cm) Weight: 69.7 kg BMI (Calculated): 28.1   Physical Exam:  Constitutional: alert, cooperative and no distress  Respiratory: breathing non-labored at rest  Cardiovascular: regular rate and sinus rhythm  Gastrointestinal: soft, non-tender, and non-distended.  Negative pressure dressing on midline.  Labs:  CBC Latest Ref Rng & Units 03/25/2020 03/24/2020 11/10/2017  WBC 4.0 - 10.5 K/uL 8.8 6.6 11.4(H)  Hemoglobin 12.0 - 15.0 g/dL 10.9(L) 11.8(L) 12.6  Hematocrit 36 - 46 % 31.6(L) 33.2(L) 36.3  Platelets 150 - 400 K/uL 240 250 228   CMP Latest Ref Rng & Units 03/25/2020 03/24/2020 11/09/2017  Glucose 70 - 99 mg/dL 01/09/2018) 671(I) 458(K)  BUN 8 - 23 mg/dL 22 20 8   Creatinine 0.44 - 1.00 mg/dL 998(P) ) 3.82(N  Sodium 135 - 145 mmol/L 137 135 138  Potassium 3.5 - 5.1 mmol/L 3.5 3.4(L) 3.5  Chloride 98 - 111 mmol/L 97(L) 94(L) 105  CO2 22 - 32 mmol/L 27 26 24   Calcium 8.9 - 10.3 mg/dL 7.6(L) 8.3(L) 8.0(L)  Total Protein 6.5 - 8.1 g/dL - 6.2(L) -  Total Bilirubin 0.3 - 1.2 mg/dL - 1.2 -  Alkaline Phos 38 - 126 U/L - 55 -  AST 15 - 41 U/L - 19 -  ALT 0 - 44 U/L - 13 -    Imaging studies: No new pertinent imaging studies   Assessment/Plan:  74 y.o.femalewith acute perforated  appendicitis3 Day Post-Ops/p open appendectomy, complicated by pertinent comorbidities includingGERD.  Patient recovering adequately.  The drain is slowly decreasing output.  There is no sign of active infection at this moment without fever or tachycardia.  No purulence from the output of the JP.  Patient still need to continue IV antibiotic therapy due to the severity of intra-abdominal pus.  We will continue with full liquids.  Patient ambulated today with PT.  Encourage patient to continue ambulating.  We will continue with DVT prophylaxis.  7.67, MD

## 2020-03-27 NOTE — Evaluation (Signed)
Physical Therapy Evaluation Patient Details Name: Sonia Ochoa MRN: [id number] DOB: 12-Sep-1945 Today's Date: 03/27/2020   History of Present Illness  74 y.o. female presented to [facility] ED for evaluation of abdominal pain. Patient reports started having abdominal pain 4 days ago. PMH includes: RSD, hyperlipidemia, GERD. S/P open appendectomy.  Clinical Impression  Patient received in bed, asking for ice pack to be refilled. Agrees to PT assessment. Reports abdominal pain. Patient is mod independent with bed mobility. Transfers with min assist. She began ambulation pushing IV pole, then after about 50 feet was able to ambulate without UE assist and min guard. She will continue to benefit from skilled PT while here to improve functional mobility and independence.     Follow Up Recommendations Home health PT    Equipment Recommendations  None recommended by PT    Recommendations for Other Services       Precautions / Restrictions Precautions Precautions: Fall Restrictions Weight Bearing Restrictions: No      Mobility  Bed Mobility Overal bed mobility: Modified Independent             General bed mobility comments: increased time use of rails, no physical assist provided. Cues for log rolling to minimize pain  Transfers Overall transfer level: Needs assistance Equipment used: 1 person hand held assist Transfers: Sit to/from Stand Sit to Stand: Min assist            Ambulation/Gait Ambulation/Gait assistance: Min guard Gait Distance (Feet): 150 Feet Assistive device: IV Pole;None Gait Pattern/deviations: Step-through pattern;Decreased stride length Gait velocity: decreased   General Gait Details: patient began walking pushing IV pole for support, after a short distance she was able to ambulate without UE support and min guard.  Stairs            Wheelchair Mobility    Modified Rankin (Stroke Patients Only)       Balance Overall balance  assessment: Mild deficits observed, not formally tested                                           Pertinent Vitals/Pain Pain Assessment: Faces Faces Pain Scale: Hurts little more Pain Location: abdomen Pain Descriptors / Indicators: Sore Pain Intervention(s): Monitored during session    Home Living Family/patient expects to be discharged to:: Private residence Living Arrangements: Spouse/significant other;Children Available Help at Discharge: Family;Available 24 hours/day Type of Home: House Home Access: Level entry     Home Layout: One level 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 gallery manager      Prior Function Level of Independence: Needs assistance   Gait / Transfers Assistance Needed: states she can walk without walker, but depends on her RSD how much walking she can tolerate, therefore has scooter  ADL's / Homemaking Assistance Needed: needs assistance        Hand Dominance        Extremity/Trunk Assessment   Upper Extremity Assessment Upper Extremity Assessment: Overall WFL for tasks assessed    Lower Extremity Assessment Lower Extremity Assessment: Overall WFL for tasks assessed    Cervical / Trunk Assessment Cervical / Trunk Assessment: Normal  Communication   Communication: No difficulties  Cognition Arousal/Alertness: Awake/alert Behavior During Therapy: WFL for tasks assessed/performed Overall Cognitive Status: Within Functional Limits for tasks assessed  General Comments      Exercises     Assessment/Plan    PT Assessment Patient needs continued PT services  PT Problem List Decreased strength;Decreased mobility;Decreased activity tolerance;Pain;Decreased knowledge of precautions       PT Treatment Interventions Therapeutic activities;Gait training;Therapeutic exercise;Patient/family education;Functional mobility training    PT Goals (Current goals can be found in the Care Plan  section)  Acute Rehab PT Goals Patient Stated Goal: to return home PT Goal Formulation: With patient Time For Goal Achievement: 04/10/20 Potential to Achieve Goals: Good    Frequency Min 2X/week   Barriers to discharge        Co-evaluation               AM-PAC PT "6 Clicks" Mobility  Outcome Measure Help needed turning from your back to your side while in a flat bed without using bedrails?: A Little Help needed moving from lying on your back to sitting on the side of a flat bed without using bedrails?: A Little Help needed moving to and from a bed to a chair (including a wheelchair)?: A Little Help needed standing up from a chair using your arms (e.g., wheelchair or bedside chair)?: A Little Help needed to walk in hospital room?: A Little Help needed climbing 3-5 steps with a railing? : A Little 6 Click Score: 18    End of Session Equipment Utilized During Treatment: Gait belt Activity Tolerance: Patient tolerated treatment well Patient left: in bed;with call bell/phone within reach;with bed alarm set;with SCD's reapplied Nurse Communication: Mobility status PT Visit Diagnosis: Muscle weakness (generalized) (M62.81);Difficulty in walking, not elsewhere classified (R26.2);Pain Pain - part of body:  (abdomen)    Time: 8295-6213 PT Time Calculation (min) (ACUTE ONLY): 28 min   Charges:   PT Evaluation $PT Eval Moderate Complexity: 1 Mod PT Treatments $Gait Training: 8-22 mins        Aeron Lheureux, PT, GCS 03/27/20,2:24 PM

## 2020-03-28 LAB — CBC WITH DIFFERENTIAL/PLATELET
Abs Immature Granulocytes: 1.06 10*3/uL — ABNORMAL HIGH (ref 0.00–0.07)
Basophils Absolute: 0.1 10*3/uL (ref 0.0–0.1)
Basophils Relative: 1 %
Eosinophils Absolute: 0.1 10*3/uL (ref 0.0–0.5)
Eosinophils Relative: 1 %
HCT: 30.9 % — ABNORMAL LOW (ref 36.0–46.0)
Hemoglobin: 10.8 g/dL — ABNORMAL LOW (ref 12.0–15.0)
Immature Granulocytes: 7 %
Lymphocytes Relative: 14 %
Lymphs Abs: 2.2 10*3/uL (ref 0.7–4.0)
MCH: 31.9 pg (ref 26.0–34.0)
MCHC: 35 g/dL (ref 30.0–36.0)
MCV: 91.2 fL (ref 80.0–100.0)
Monocytes Absolute: 1.1 10*3/uL — ABNORMAL HIGH (ref 0.1–1.0)
Monocytes Relative: 7 %
Neutro Abs: 11.2 10*3/uL — ABNORMAL HIGH (ref 1.7–7.7)
Neutrophils Relative %: 70 %
Platelets: 299 10*3/uL (ref 150–400)
RBC: 3.39 MIL/uL — ABNORMAL LOW (ref 3.87–5.11)
RDW: 14.5 % (ref 11.5–15.5)
WBC: 15.8 10*3/uL — ABNORMAL HIGH (ref 4.0–10.5)
nRBC: 0 % (ref 0.0–0.2)

## 2020-03-28 LAB — BASIC METABOLIC PANEL
Anion gap: 6 (ref 5–15)
BUN: 11 mg/dL (ref 8–23)
CO2: 30 mmol/L (ref 22–32)
Calcium: 7.9 mg/dL — ABNORMAL LOW (ref 8.9–10.3)
Chloride: 103 mmol/L (ref 98–111)
Creatinine, Ser: 0.83 mg/dL (ref 0.44–1.00)
GFR calc Af Amer: >60 mL/min (ref >60)
GFR calc non Af Amer: >60 mL/min (ref >60)
Glucose, Bld: 125 mg/dL — ABNORMAL HIGH (ref 70–99)
Potassium: 3.1 mmol/L — ABNORMAL LOW (ref 3.5–5.1)
Sodium: 139 mmol/L (ref 135–145)

## 2020-03-28 MED ORDER — PANTOPRAZOLE SODIUM 40 MG PO TBEC
40.0000 mg | DELAYED_RELEASE_TABLET | Freq: Every day | ORAL | Status: DC
  Administered 2020-03-28 – 2020-04-07 (×10): 40 mg via ORAL
  Filled 2020-03-28 (×10): qty 1

## 2020-03-28 MED ORDER — POLYETHYLENE GLYCOL 3350 17 G PO PACK
17.0000 g | PACK | Freq: Two times a day (BID) | ORAL | Status: DC
  Administered 2020-03-28 – 2020-03-29 (×4): 17 g via ORAL
  Filled 2020-03-28 (×5): qty 1

## 2020-03-28 MED ORDER — FAMOTIDINE 20 MG PO TABS
20.0000 mg | ORAL_TABLET | Freq: Two times a day (BID) | ORAL | Status: DC
  Administered 2020-03-28 – 2020-04-08 (×20): 20 mg via ORAL
  Filled 2020-03-28 (×20): qty 1

## 2020-03-28 NOTE — Progress Notes (Signed)
PHARMACIST - PHYSICIAN COMMUNICATION  DR:  Hazle Quant  CONCERNING: IV to Oral Route Change Policy  RECOMMENDATION: This patient is receiving famotidine/pantoprazole by the intravenous route.  Based on criteria approved by the Pharmacy and Therapeutics Committee, the intravenous medication(s) is/are being converted to the equivalent oral dose form(s).   DESCRIPTION: These criteria include:  The patient is eating (either orally or via tube) and/or has been taking other orally administered medications for a least 24 hours  The patient has no evidence of active gastrointestinal bleeding or impaired GI absorption (gastrectomy, short bowel, patient on TNA or NPO).  If you have questions about this conversion, please contact the Pharmacy Department  []   [phone] )  ( [phone] [x]   [phone] )  [facility] []   [phone] )  [facility] []   [phone] )  [facility] []   [phone] )  [facility]   ( [phone], PharmD, BCPS Clinical Pharmacist 03/28/2020 10:12 AM

## 2020-03-28 NOTE — Progress Notes (Signed)
SURGICAL PROGRESS NOTE   Hospital Day(s): 4.   Post op day(s): 4 Days Post-Op.   Interval History: Patient seen and examined, no acute events or new complaints overnight. Patient reports feeling pain on her back. Denies nausea or vomiting. Reports having constipation.   Vital signs in last 24 hours: [min-max] current  Temp:  [99 F (37.2 C)-99.6 F (37.6 C)] 99.6 F (37.6 C) (07/28 1305) Pulse Rate:  [88-96] 88 (07/28 1305) Resp:  [16] 16 (07/28 1305) BP: (134-139)/(71-82) 134/71 (07/28 1305) SpO2:  [85 %-95 %] 95 % (07/28 1305)     Height: 5\' 2"  (157.5 cm) Weight: 69.7 kg BMI (Calculated): 28.1   Physical Exam:  Constitutional: alert, cooperative and no distress  Respiratory: breathing non-labored at rest  Cardiovascular: regular rate and sinus rhythm  Gastrointestinal: soft, non-tender, and non-distended  Labs:  CBC Latest Ref Rng & Units 03/28/2020 03/25/2020 03/24/2020  WBC 4.0 - 10.5 K/uL 15.8(H) 8.8 6.6  Hemoglobin 12.0 - 15.0 g/dL 10.8(L) 10.9(L) 11.8(L)  Hematocrit 36 - 46 % 30.9(L) 31.6(L) 33.2(L)  Platelets 150 - 400 K/uL 299 240 250   CMP Latest Ref Rng & Units 03/28/2020 03/25/2020 03/24/2020  Glucose 70 - 99 mg/dL 03/26/2020) 751(W) 258(N)  BUN 8 - 23 mg/dL 11 22 20   Creatinine 0.44 - 1.00 mg/dL 277(O ) 2.42)  Sodium 135 - 145 mmol/L 139 137 135  Potassium 3.5 - 5.1 mmol/L 3.1(L) 3.5 3.4(L)  Chloride 98 - 111 mmol/L 103 97(L) 94(L)  CO2 22 - 32 mmol/L 30 27 26   Calcium 8.9 - 10.3 mg/dL 7.9(L) 7.6(L) 8.3(L)  Total Protein 6.5 - 8.1 g/dL - - 6.2(L)  Total Bilirubin 0.3 - 1.2 mg/dL - - 1.2  Alkaline Phos 38 - 126 U/L - - 55  AST 15 - 41 U/L - - 19  ALT 0 - 44 U/L - - 13    Imaging studies: No new pertinent imaging studies   Assessment/Plan:  74 y.o.femalewith acute perforated appendicitis3Day Post-Ops/p open appendectomy, complicated by pertinent comorbidities includingGERD.    Patient recovering slowly. No nausea or vomiting. Will advance diet.  Due to increase in WBC count and the severe purulent peritonitis, will perform CT scan of the abdomen and pelvis tomorrow to rule out intra abdominal abscess. Encourage the patient to ambulate.   1.44(R, MD

## 2020-03-28 NOTE — TOC Progression Note (Signed)
Transition of Care [facility]) - Progression Note    Patient Details  Name: Sonia Ochoa MRN: [id number] Date of Birth: Apr 14, 1946  [facility]) CM/SW Contact  Eilleen Kempf, LCSW Phone Number: 03/28/2020, 4:14 PM  Clinical Narrative:    CSW spoke with [facility]. They will provide home health for patient when ready to discharge.         Expected Discharge Plan and Services                                                 Social Determinants of Health (SDOH) Interventions    Readmission Risk Interventions No flowsheet data found.

## 2020-03-28 NOTE — Plan of Care (Signed)
Continuing with plan of care. 

## 2020-03-29 ENCOUNTER — Encounter: Payer: Self-pay | Admitting: General Surgery

## 2020-03-29 ENCOUNTER — Inpatient Hospital Stay: Payer: Medicare Other

## 2020-03-29 MED ORDER — METRONIDAZOLE IN NACL 5-0.79 MG/ML-% IV SOLN
500.0000 mg | Freq: Three times a day (TID) | INTRAVENOUS | Status: DC
  Administered 2020-03-29 – 2020-04-08 (×29): 500 mg via INTRAVENOUS
  Filled 2020-03-29 (×36): qty 100

## 2020-03-29 MED ORDER — IOHEXOL 9 MG/ML PO SOLN
500.0000 mL | ORAL | Status: AC
  Administered 2020-03-29 (×2): 500 mL via ORAL

## 2020-03-29 MED ORDER — IOHEXOL 300 MG/ML  SOLN
100.0000 mL | Freq: Once | INTRAMUSCULAR | Status: AC | PRN
  Administered 2020-03-29: 100 mL via INTRAVENOUS

## 2020-03-29 NOTE — Progress Notes (Signed)
SURGICAL PROGRESS NOTE   Hospital Day(s): 5.   Post op day(s): 5 Days Post-Op.   Interval History: Patient seen and examined, no acute events or new complaints overnight. Patient reports having some pain on her back.  She still complaining about not being able to do a bowel movement.  Vital signs in last 24 hours: [min-max] current  Temp:  [98.3 F (36.8 C)-100.3 F (37.9 C)] 99.6 F (37.6 C) (07/29 1543) Pulse Rate:  [86-110] 93 (07/29 1326) Resp:  [16-20] 16 (07/29 1326) BP: (118-131)/(59-76) 118/75 (07/29 1326) SpO2:  [91 %-95 %] 95 % (07/29 1326)     Height: 5\' 2"  (157.5 cm) Weight: 69.7 kg BMI (Calculated): 28.1   Physical Exam:  Constitutional: alert, cooperative and no distress  Respiratory: breathing non-labored at rest  Cardiovascular: regular rate and sinus rhythm  Gastrointestinal: soft, non-tender, and non-distended.  Wound is dry and clean.  Labs:  CBC Latest Ref Rng & Units 03/28/2020 03/25/2020 03/24/2020  WBC 4.0 - 10.5 K/uL 15.8(H) 8.8 6.6  Hemoglobin 12.0 - 15.0 g/dL 10.8(L) 10.9(L) 11.8(L)  Hematocrit 36 - 46 % 30.9(L) 31.6(L) 33.2(L)  Platelets 150 - 400 K/uL 299 240 250   CMP Latest Ref Rng & Units 03/28/2020 03/25/2020 03/24/2020  Glucose 70 - 99 mg/dL 03/26/2020) 585(I) 778(E)  BUN 8 - 23 mg/dL 11 22 20   Creatinine 0.44 - 1.00 mg/dL 423(N ) 3.61)  Sodium 135 - 145 mmol/L 139 137 135  Potassium 3.5 - 5.1 mmol/L 3.1(L) 3.5 3.4(L)  Chloride 98 - 111 mmol/L 103 97(L) 94(L)  CO2 22 - 32 mmol/L 30 27 26   Calcium 8.9 - 10.3 mg/dL 7.9(L) 7.6(L) 8.3(L)  Total Protein 6.5 - 8.1 g/dL - - 6.2(L)  Total Bilirubin 0.3 - 1.2 mg/dL - - 1.2  Alkaline Phos 38 - 126 U/L - - 55  AST 15 - 41 U/L - - 19  ALT 0 - 44 U/L - - 13    Imaging studies: CT scan of the abdominal pelvis shows multiple interloop abscess.  They are not amenable for drainage due to being interloop.  CT ABDOMEN AND PELVIS WITH CONTRAST  TECHNIQUE: Multidetector CT imaging of the abdomen and  pelvis was performed using the standard protocol following bolus administration of intravenous contrast.  CONTRAST:  4.43(X OMNIPAQUE IOHEXOL 300 MG/ML  SOLN  COMPARISON:  03/24/2020  FINDINGS: Lower chest: Small right effusion and trace left effusion. Dependent and/or compressive atelectasis in the lower lobes.  Hepatobiliary: Small low-density lesion in the right hepatic lobe most compatible with cyst. Gallbladder unremarkable.  Pancreas: No focal abnormality or ductal dilatation.  Spleen: No focal abnormality.  Normal size.  Adrenals/Urinary Tract: No hydronephrosis. No renal or adrenal mass. Urinary bladder unremarkable.  Stomach/Bowel: Sigmoid and descending colonic diverticulosis. No active diverticulitis. Changes of appendectomy. No evidence of bowel obstruction. Stomach is mildly distended with gas and barium.  Vascular/Lymphatic: Aortic atherosclerosis. No enlarged abdominal or pelvic lymph nodes.  Reproductive: Uterus and adnexa unremarkable.  No mass.  Other: Surgical drain in place in the right lower quadrant. Inflammation seen throughout the right lower quadrant. There are multiple focal fluid collections seen. Deep midline fluid collection on image 51 measures 4.4 x 3.2 cm. Second midline deep fluid collection measures 8.0 x 2.7 cm on image 59. Smaller right abdominal fluid collection on image 62.  Musculoskeletal: No acute bony abnormality.  IMPRESSION: Changes of prior appendectomy. Inflammatory stranding continues in the right lower quadrant with surgical drain in place. No fluid around  the surgical drain. However, there are multiple fluid collections throughout the abdomen as described above most compatible with abscesses.  Left colonic diverticulosis.  Aortic atherosclerosis.  Small bilateral pleural effusions. Dependent/compressive atelectasis in the lower lobes.   Electronically Signed   By: Charlett Nose M.D.   On:  03/29/2020 11:17  Assessment/Plan:  74 y.o.femalewith acute perforated appendicitis5Day Post-Ops/p open appendectomy, complicated by pertinent comorbidities includingGERD.  Patient recovering slowly.  She had increased white blood cell count and a very low-grade fever.  CT scan shows interloop abscess.  This explain her increasing white blood cell count and low-grade fever.  Since it is not amenable for drainage, I will need to continue with IV antibiotic therapy.  I will add Flagyl for better anaerobic coverage.  I encouraged the patient to ambulate.  We will continue with DVT prophylaxis.  We will continue with MiraLAX for helping her with a bowel movement.   Gae Gallop, MD

## 2020-03-29 NOTE — Progress Notes (Addendum)
   03/29/20 0800  Clinical Encounter Type  Visited With Patient  Visit Type Initial  Referral From Nurse  Consult/Referral To Chaplain  Chaplain checked in on patient. Patient told chaplain that she could not finish drinking the liquid in the bottle. She is aware that she needs to drink it for her procedure, but she said her stomach is hurting. Chaplain encouraged patient take her time drinking the rest.  Chaplain walked out of the room and heard patient moaning and went back in. Patient said she is in so much pain. Chaplain asked if she could pray for her and she hesitated and said "no, I am Jehovah Witness." Patient told chaplain she was kind and that she likes her." Chaplain told patient that she will check on her later today.  Chaplain checked in on patient later in the day after her procedure and found her sitting in the chair. Patient was glad to see chaplain and gave her a thumbs up.

## 2020-03-30 ENCOUNTER — Inpatient Hospital Stay: Payer: Medicare Other

## 2020-03-30 MED ORDER — POLYETHYLENE GLYCOL 3350 17 G PO PACK
17.0000 g | PACK | Freq: Every day | ORAL | Status: DC
  Administered 2020-03-31: 17 g via ORAL
  Filled 2020-03-30: qty 1

## 2020-03-30 NOTE — Progress Notes (Signed)
SURGICAL PROGRESS NOTE   Hospital Day(s): 6.   Post op day(s): 6 Days Post-Op.   Interval History: Patient seen and examined, no acute events or new complaints overnight. Patient reports feeling bloated and vomiting  Vital signs in last 24 hours: [min-max] current  Temp:  [98.2 F (36.8 C)-100.3 F (37.9 C)] 98.2 F (36.8 C) (07/30 1231) Pulse Rate:  [88-96] 88 (07/30 1231) Resp:  [16-20] 20 (07/30 1231) BP: (118-161)/(75-86) 161/77 (07/30 1231) SpO2:  [93 %-98 %] 98 % (07/30 1231)     Height: 5\' 2"  (157.5 cm) Weight: 69.7 kg BMI (Calculated): 28.1   Physical Exam:  Constitutional: alert, cooperative and no distress  Respiratory: breathing non-labored at rest  Cardiovascular: regular rate and sinus rhythm  Gastrointestinal: soft, non-tender, and non-distended  Labs:  CBC Latest Ref Rng & Units 03/28/2020 03/25/2020 03/24/2020  WBC 4.0 - 10.5 K/uL 15.8(H) 8.8 6.6  Hemoglobin 12.0 - 15.0 g/dL 10.8(L) 10.9(L) 11.8(L)  Hematocrit 36 - 46 % 30.9(L) 31.6(L) 33.2(L)  Platelets 150 - 400 K/uL 299 240 250   CMP Latest Ref Rng & Units 03/28/2020 03/25/2020 03/24/2020  Glucose 70 - 99 mg/dL 03/26/2020) 132(G) 401(U)  BUN 8 - 23 mg/dL 11 22 20   Creatinine 0.44 - 1.00 mg/dL 272(Z ) 3.66)  Sodium 135 - 145 mmol/L 139 137 135  Potassium 3.5 - 5.1 mmol/L 3.1(L) 3.5 3.4(L)  Chloride 98 - 111 mmol/L 103 97(L) 94(L)  CO2 22 - 32 mmol/L 30 27 26   Calcium 8.9 - 10.3 mg/dL 7.9(L) 7.6(L) 8.3(L)  Total Protein 6.5 - 8.1 g/dL - - 6.2(L)  Total Bilirubin 0.3 - 1.2 mg/dL - - 1.2  Alkaline Phos 38 - 126 U/L - - 55  AST 15 - 41 U/L - - 19  ALT 0 - 44 U/L - - 13    Imaging studies: No new pertinent imaging studies   Assessment/Plan:  74 y.o.femalewith acute perforated appendicitis6Day Post-Ops/p open appendectomy, complicated by pertinent comorbidities includingGERD.  Patient with post op ileus vs gastroparesis. Due to persistent nausea and vomiting, will put NGT for decompression of  the stomach. Patient had multiple bowel movements. Will keep NPO. Will continue with IV abx therapy due to intra abdominal abscess. Will get new labs in the morning for evaluation of electrolytes and WBC count. No fever today. Encourage to ambulate.   75.74(Q, MD

## 2020-03-30 NOTE — Progress Notes (Signed)
PT Cancellation Note  Patient Details Name: Sonia Ochoa MRN: [id number] DOB: [birth year]   Cancelled Treatment:    Reason Eval/Treat Not Completed: Fatigue/lethargy limiting ability to participate (Attempted to treat patient, upon entering patient room patient asleep and unable to be roused. Will attempt again at later time/date as available.)  Precious Bard, PT, DPT   03/30/2020, 2:25 PM

## 2020-03-30 NOTE — Care Management Important Message (Signed)
Important Message  Patient Details  Name: Aracelly Tencza MRN: [id number] Date of Birth: Jan 06, 1946   Medicare Important Message Given:  Yes     Johnell Comings 03/30/2020, 11:38 AM

## 2020-03-30 NOTE — Progress Notes (Signed)
Physical Therapy Treatment Patient Details Name: Sonia Ochoa MRN: [id number] DOB: [birth year] Today's Date: 03/30/2020    History of Present Illness 74 y.o. female presented to [facility] ED for evaluation of abdominal pain. Patient reports started having abdominal pain 4 days ago. PMH includes: RSD, hyperlipidemia, GERD. S/P open appendectomy.    PT Comments    Patient is in bed and eager to participate with therapy upon PT arrival. Educated and performed supine interventions with patient verbalizing and demonstrating understanding to perform daily while not with PT in bed. Seated interventions performed with patient transitioning mod I to EOB. Initial STS required Min A however sit to stand squats performed consecutively for 10x required CGA and SUE support only. Standing balance performed x 3-5 minutes w/o UE support with increasing challenge. Ambulation not performed this session due to fatigue and patient limited desire due to new NG tube. Patient returned to bed with needs met and alarm back on. Current POC remains appropriate at this time.     Follow Up Recommendations  Home health PT     Equipment Recommendations  None recommended by PT    Recommendations for Other Services       Precautions / Restrictions Precautions Precautions: Fall Restrictions Weight Bearing Restrictions: No    Mobility  Bed Mobility Overal bed mobility: Modified Independent             General bed mobility comments: increased time use of rails, no physical assist provided. Cues for log rolling to minimize pain  Transfers Overall transfer level: Needs assistance Equipment used: 1 person hand held assist Transfers: Sit to/from Stand Sit to Stand: Min assist;Min guard         General transfer comment: first attempt requires min A , rest able to be performed with CGA  Ambulation/Gait                 Stairs             Wheelchair Mobility    Modified Rankin (Stroke  Patients Only)       Balance Overall balance assessment: Mild deficits observed, not formally tested                                          Cognition Arousal/Alertness: Awake/alert Behavior During Therapy: WFL for tasks assessed/performed Overall Cognitive Status: Within Functional Limits for tasks assessed                                        Exercises General Exercises - Lower Extremity Ankle Circles/Pumps: Strengthening;Both;20 reps;Supine;Seated Gluteal Sets: Strengthening;Both;10 reps;Supine Long Arc Quad: Strengthening;Both;10 reps;Seated Heel Slides: Strengthening;Both;10 reps;Supine Hip ABduction/ADduction: Strengthening;Both;10 reps;Supine;Standing Straight Leg Raises: Strengthening;Both;10 reps;Supine Hip Flexion/Marching: Strengthening;Both;15 reps;Supine;Seated;Standing (30x total) Mini-Sqauts: Strengthening;Both;10 reps;Standing Other Exercises Other Exercises: Patient educated and performed supine (to be done during the day when not with PT), seated, and standing strengthening interventions.    General Comments        Pertinent Vitals/Pain Pain Assessment: 0-10 Pain Score: 2  Pain Location: abdomen Pain Descriptors / Indicators: Sore Pain Intervention(s): Monitored during session;Limited activity within patient's tolerance;Repositioned    Home Living                      Prior Function  PT Goals (current goals can now be found in the care plan section) Acute Rehab PT Goals Patient Stated Goal: to return home PT Goal Formulation: With patient Time For Goal Achievement: 04/10/20 Potential to Achieve Goals: Good Progress towards PT goals: Progressing toward goals    Frequency    Min 2X/week      PT Plan Current plan remains appropriate    Co-evaluation              AM-PAC PT "6 Clicks" Mobility   Outcome Measure  Help needed turning from your back to your side while in a flat  bed without using bedrails?: None Help needed moving from lying on your back to sitting on the side of a flat bed without using bedrails?: A Little Help needed moving to and from a bed to a chair (including a wheelchair)?: A Little Help needed standing up from a chair using your arms (e.g., wheelchair or bedside chair)?: A Little Help needed to walk in hospital room?: A Little Help needed climbing 3-5 steps with a railing? : A Little 6 Click Score: 19    End of Session Equipment Utilized During Treatment: Gait belt Activity Tolerance: Patient tolerated treatment well Patient left: in bed;with call bell/phone within reach;with bed alarm set;with SCD's reapplied Nurse Communication: Mobility status PT Visit Diagnosis: Muscle weakness (generalized) (M62.81);Difficulty in walking, not elsewhere classified (R26.2);Pain Pain - part of body:  (abdomen)     Time: 1364-3837 PT Time Calculation (min) (ACUTE ONLY): 24 min  Charges:  $Therapeutic Exercise: 23-37 mins                    Janna Arch, PT, DPT   03/30/2020, 4:06 PM

## 2020-03-31 LAB — MAGNESIUM: Magnesium: 2 mg/dL (ref 1.7–2.4)

## 2020-03-31 LAB — CBC
HCT: 33.2 % — ABNORMAL LOW (ref 36.0–46.0)
Hemoglobin: 11.2 g/dL — ABNORMAL LOW (ref 12.0–15.0)
MCH: 31.5 pg (ref 26.0–34.0)
MCHC: 33.7 g/dL (ref 30.0–36.0)
MCV: 93.5 fL (ref 80.0–100.0)
Platelets: 452 10*3/uL — ABNORMAL HIGH (ref 150–400)
RBC: 3.55 MIL/uL — ABNORMAL LOW (ref 3.87–5.11)
RDW: 14.2 % (ref 11.5–15.5)
WBC: 17.2 10*3/uL — ABNORMAL HIGH (ref 4.0–10.5)
nRBC: 0 % (ref 0.0–0.2)

## 2020-03-31 LAB — PHOSPHORUS: Phosphorus: 1.8 mg/dL — ABNORMAL LOW (ref 2.5–4.6)

## 2020-03-31 LAB — BASIC METABOLIC PANEL
Anion gap: 14 (ref 5–15)
BUN: 12 mg/dL (ref 8–23)
CO2: 26 mmol/L (ref 22–32)
Calcium: 8 mg/dL — ABNORMAL LOW (ref 8.9–10.3)
Chloride: 101 mmol/L (ref 98–111)
Creatinine, Ser: 0.7 mg/dL (ref 0.44–1.00)
GFR calc Af Amer: >60 mL/min (ref >60)
GFR calc non Af Amer: >60 mL/min (ref >60)
Glucose, Bld: 95 mg/dL (ref 70–99)
Potassium: 2.9 mmol/L — ABNORMAL LOW (ref 3.5–5.1)
Sodium: 141 mmol/L (ref 135–145)

## 2020-03-31 MED ORDER — SODIUM CHLORIDE 0.9 % IV SOLN: INTRAVENOUS | Status: DC

## 2020-03-31 MED ORDER — POTASSIUM PHOSPHATES 15 MMOLE/5ML IV SOLN
20.0000 mmol | Freq: Once | INTRAVENOUS | Status: AC
  Administered 2020-03-31: 20 mmol via INTRAVENOUS
  Filled 2020-03-31: qty 6.67

## 2020-03-31 MED ORDER — POTASSIUM CHLORIDE IN NACL 20-0.9 MEQ/L-% IV SOLN
INTRAVENOUS | Status: DC
  Filled 2020-03-31 (×7): qty 1000

## 2020-03-31 NOTE — Progress Notes (Signed)
SURGICAL PROGRESS NOTE   Hospital Day(s): 7.   Post op day(s): 7 Days Post-Op.   Interval History: Patient seen and examined, no acute events or new complaints overnight. Patient reports feeling much better today after NGT was placed.  Patient denied nausea or vomiting.  Patient had 3 bowel movements.  Patient reported that the abdomen is feeling more comfortable.  Vital signs in last 24 hours: [min-max] current  Temp:  [98.2 F (36.8 C)-98.4 F (36.9 C)] 98.4 F (36.9 C) (07/31 0357) Pulse Rate:  [88-97] 89 (07/31 0357) Resp:  [20] 20 (07/31 0357) BP: (143-161)/(77-85) 143/83 (07/31 0357) SpO2:  [93 %-98 %] 93 % (07/31 0357)     Height: 5\' 2"  (157.5 cm) Weight: 69.7 kg BMI (Calculated): 28.1   Physical Exam:  Constitutional: alert, cooperative and no distress  Respiratory: breathing non-labored at rest  Cardiovascular: regular rate and sinus rhythm  Gastrointestinal: soft, non-tender, and non-distended.  Wound is dry and clean.  Labs:  CBC Latest Ref Rng & Units 03/28/2020 03/25/2020 03/24/2020  WBC 4.0 - 10.5 K/uL 15.8(H) 8.8 6.6  Hemoglobin 12.0 - 15.0 g/dL 10.8(L) 10.9(L) 11.8(L)  Hematocrit 36 - 46 % 30.9(L) 31.6(L) 33.2(L)  Platelets 150 - 400 K/uL 299 240 250   CMP Latest Ref Rng & Units 03/28/2020 03/25/2020 03/24/2020  Glucose 70 - 99 mg/dL 03/26/2020) 195(K) 932(I)  BUN 8 - 23 mg/dL 11 22 20   Creatinine 0.44 - 1.00 mg/dL 712(W ) 5.80)  Sodium 135 - 145 mmol/L 139 137 135  Potassium 3.5 - 5.1 mmol/L 3.1(L) 3.5 3.4(L)  Chloride 98 - 111 mmol/L 103 97(L) 94(L)  CO2 22 - 32 mmol/L 30 27 26   Calcium 8.9 - 10.3 mg/dL 7.9(L) 7.6(L) 8.3(L)  Total Protein 6.5 - 8.1 g/dL - - 6.2(L)  Total Bilirubin 0.3 - 1.2 mg/dL - - 1.2  Alkaline Phos 38 - 126 U/L - - 55  AST 15 - 41 U/L - - 19  ALT 0 - 44 U/L - - 13    Imaging studies: No new pertinent imaging studies   Assessment/Plan:  74 y.o.femalewith acute perforated appendicitis7Day Post-Ops/p open appendectomy,  complicated by pertinent comorbidities includingGERD.  Patient today with no clinical deterioration.  She had gastroparesis versus postop ileus.  NGT was placed yesterday.  Decompression of the stomach because immediate relief of her pains.  Patient is to continue IV antibiotic therapy due to intra-abdominal abscess.  We will repeat labs today for assessment of white blood cell count and electrolytes.  We will continue with DVT prophylaxis.  I encouraged the patient to ambulate.  3.82(N, MD

## 2020-04-01 MED ORDER — GUAIFENESIN-DM 100-10 MG/5ML PO SYRP
5.0000 mL | ORAL_SOLUTION | ORAL | Status: DC | PRN
  Administered 2020-04-01 – 2020-04-04 (×7): 5 mL via ORAL
  Filled 2020-04-01 (×7): qty 5

## 2020-04-01 NOTE — Progress Notes (Signed)
SURGICAL PROGRESS NOTE   Hospital Day(s): 8.   Post op day(s): 8 Days Post-Op.   Interval History: Patient seen and examined, no acute events or new complaints overnight. Patient reports feeling better. She report passing gas. No fever.   Vital signs in last 24 hours: [min-max] current  Temp:  [98.8 F (37.1 C)-99 F (37.2 C)] 99 F (37.2 C) (08/01 0515) Pulse Rate:  [86-89] 89 (08/01 0515) Resp:  [19-20] 19 (08/01 0515) BP: (151)/(73-79) 151/79 (08/01 0515) SpO2:  [92 %-95 %] 92 % (08/01 0515)     Height: 5\' 2"  (157.5 cm) Weight: 69.7 kg BMI (Calculated): 28.1   Physical Exam:  Constitutional: alert, cooperative and no distress  Respiratory: breathing non-labored at rest  Cardiovascular: regular rate and sinus rhythm  Gastrointestinal: soft, non-tender, and non-distended. Wound is dry and clean.   Labs:  CBC Latest Ref Rng & Units 03/31/2020 03/28/2020 03/25/2020  WBC 4.0 - 10.5 K/uL 17.2(H) 15.8(H) 8.8  Hemoglobin 12.0 - 15.0 g/dL 11.2(L) 10.8(L) 10.9(L)  Hematocrit 36 - 46 % 33.2(L) 30.9(L) 31.6(L)  Platelets 150 - 400 K/uL 452(H) 299 240   CMP Latest Ref Rng & Units 03/31/2020 03/28/2020 03/25/2020  Glucose 70 - 99 mg/dL 95 03/27/2020) 431(V)  BUN 8 - 23 mg/dL 12 11 22   Creatinine 0.44 - 1.00 mg/dL 400(Q 6.76)  Sodium 135 - 145 mmol/L 141 139 137  Potassium 3.5 - 5.1 mmol/L 2.9(L) 3.1(L) 3.5  Chloride 98 - 111 mmol/L 101 103 97(L)  CO2 22 - 32 mmol/L 26 30 27   Calcium 8.9 - 10.3 mg/dL 8.0(L) 7.9(L) 7.6(L)  Total Protein 6.5 - 8.1 g/dL - - -  Total Bilirubin 0.3 - 1.2 mg/dL - - -  Alkaline Phos 38 - 126 U/L - - -  AST 15 - 41 U/L - - -  ALT 0 - 44 U/L - - -    Imaging studies: No new pertinent imaging studies   Assessment/Plan:  74 y.o.femalewith acute perforated appendicitis8Day Post-Ops/p open appendectomy, complicated by pertinent comorbidities includingGERD.  No clinical deterioration. No fever. Passing gas but still distended. Will keep NGT in place.  Electrolytes replaced yesterday. Will repeat labs in AM. Will continue IV abx therapy. Encourage patient to ambulate.   0.93(O, MD

## 2020-04-02 LAB — BASIC METABOLIC PANEL
Anion gap: 13 (ref 5–15)
BUN: 9 mg/dL (ref 8–23)
CO2: 26 mmol/L (ref 22–32)
Calcium: 7.9 mg/dL — ABNORMAL LOW (ref 8.9–10.3)
Chloride: 104 mmol/L (ref 98–111)
Creatinine, Ser: 0.56 mg/dL (ref 0.44–1.00)
GFR calc Af Amer: >60 mL/min (ref >60)
GFR calc non Af Amer: >60 mL/min (ref >60)
Glucose, Bld: 100 mg/dL — ABNORMAL HIGH (ref 70–99)
Potassium: 2.5 mmol/L — CL (ref 3.5–5.1)
Sodium: 143 mmol/L (ref 135–145)

## 2020-04-02 LAB — CBC WITH DIFFERENTIAL/PLATELET
Abs Immature Granulocytes: 0.68 10*3/uL — ABNORMAL HIGH (ref 0.00–0.07)
Basophils Absolute: 0.1 10*3/uL (ref 0.0–0.1)
Basophils Relative: 1 %
Eosinophils Absolute: 0.1 10*3/uL (ref 0.0–0.5)
Eosinophils Relative: 0 %
HCT: 33.8 % — ABNORMAL LOW (ref 36.0–46.0)
Hemoglobin: 11.9 g/dL — ABNORMAL LOW (ref 12.0–15.0)
Immature Granulocytes: 3 %
Lymphocytes Relative: 13 %
Lymphs Abs: 2.7 10*3/uL (ref 0.7–4.0)
MCH: 31.5 pg (ref 26.0–34.0)
MCHC: 35.2 g/dL (ref 30.0–36.0)
MCV: 89.4 fL (ref 80.0–100.0)
Monocytes Absolute: 1.1 10*3/uL — ABNORMAL HIGH (ref 0.1–1.0)
Monocytes Relative: 6 %
Neutro Abs: 15.7 10*3/uL — ABNORMAL HIGH (ref 1.7–7.7)
Neutrophils Relative %: 77 %
Platelets: 516 10*3/uL — ABNORMAL HIGH (ref 150–400)
RBC: 3.78 MIL/uL — ABNORMAL LOW (ref 3.87–5.11)
RDW: 14.2 % (ref 11.5–15.5)
WBC: 20.3 10*3/uL — ABNORMAL HIGH (ref 4.0–10.5)
nRBC: 0 % (ref 0.0–0.2)

## 2020-04-02 LAB — MAGNESIUM: Magnesium: 1.9 mg/dL (ref 1.7–2.4)

## 2020-04-02 LAB — PHOSPHORUS: Phosphorus: 1.8 mg/dL — ABNORMAL LOW (ref 2.5–4.6)

## 2020-04-02 MED ORDER — POTASSIUM PHOSPHATES 15 MMOLE/5ML IV SOLN
15.0000 mmol | Freq: Once | INTRAVENOUS | Status: AC
  Administered 2020-04-02: 15 mmol via INTRAVENOUS
  Filled 2020-04-02: qty 5

## 2020-04-02 MED ORDER — POTASSIUM CHLORIDE 10 MEQ/100ML IV SOLN: 10.0000 meq | INTRAVENOUS | Status: DC

## 2020-04-02 MED ORDER — POTASSIUM CHLORIDE 10 MEQ/100ML IV SOLN
10.0000 meq | INTRAVENOUS | Status: AC
  Administered 2020-04-02 (×4): 10 meq via INTRAVENOUS
  Filled 2020-04-02 (×2): qty 100

## 2020-04-02 NOTE — Progress Notes (Addendum)
Initial Nutrition Assessment  DOCUMENTATION CODES:   Not applicable  INTERVENTION:   Recommend TPN as pt without adequate nutrition for > 7 days  Pt is at high refeed risk; recommend monitor K, Mg and P labs daily  NUTRITION DIAGNOSIS:   Inadequate oral intake related to acute illness as evidenced by NPO status.  GOAL:   Patient will meet greater than or equal to 90% of their needs  MONITOR:   Diet advancement, Labs, Weight trends, Skin, I & O's  REASON FOR ASSESSMENT:   NPO/Clear Liquid Diet    ASSESSMENT:   74 y.o. female with h/o diverticulitis and GERD who is admitted with perforated appendicitis now s/p open appendectomy 8/11 complicated by post op ileus and appendiceal abscess   Met with pt in room today. Pt reports good appetite in hospital but reports that she was unable to tolerate the soft diet as she vomited after eating. Pt has remained mainly on a NPO/liquid diet since admit and is now without adequate nutrition for > 7 days. 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 Would recommend TPN at this point to help pt meet her estimated needs. Pt is refeeding currently and electrolytes are being replaced. NGT in place with 1918m output. Pt is having flatus and BMs. Per MD note, pt's abdomen remains distended. Per chart, pt down 4.6lbs(3%) since admit. Pt does appear fairly weight stable pta. Of note, pt is willing to drink Ensure Max once diet advanced; pt does not drink supplements with sugar.   Medications reviewed and include: lovenox, pepcid, protonix, NaCl w/ KCl @75ml /hr, metronidazole, zosyn   Labs reviewed: K 2.5(L), P 1.8(L), Mg 1.9 wnl Wbc- 20.3(H)  NUTRITION - FOCUSED PHYSICAL EXAM:    Most Recent Value  Orbital Region No depletion  Upper Arm Region No depletion  Thoracic and Lumbar Region No depletion  Buccal Region No depletion   Temple Region No depletion  Clavicle Bone Region No depletion  Clavicle and Acromion Bone Region No depletion  Scapular Bone Region No depletion  Dorsal Hand No depletion  Patellar Region Moderate depletion  Anterior Thigh Region Mild depletion  Posterior Calf Region Mild depletion  Edema (RD Assessment) None  Hair Reviewed  Eyes Reviewed  Mouth Reviewed  Skin Reviewed  Nails Reviewed     Diet Order:   Diet Order            Diet NPO time specified Except for: Sips with Meds, Ice Chips  Diet effective now                EDUCATION NEEDS:   Education needs have been addressed  Skin:  Skin Assessment: Reviewed RN Assessment (incision abdomen)  Last BM:  8/1- type 6  Height:   Ht Readings from Last 1 Encounters:  03/25/20 5' 2"  (1.575 m)    Weight:   Wt Readings from Last 1 Encounters:  04/02/20 67.7 kg    Ideal Body Weight:  50 kg  BMI:  Body mass index is 27.31 kg/m.  Estimated Nutritional Needs:   Kcal:  1500-1700kcal/day  Protein:  75-85g/day  Fluid:  1.5L/day  CKoleen DistanceMS, RD, LDN Please refer to [facility]for RD and/or RD on-call/weekend/after hours pager

## 2020-04-02 NOTE — Consult Note (Signed)
PHARMACY CONSULT NOTE - FOLLOW UP  Pharmacy Consult for Electrolyte Monitoring and Replacement   Recent Labs: Potassium (mmol/L)  Date Value  04/02/2020 2.5 (LL)   Magnesium (mg/dL)  Date Value  03/49/1791 1.9   Calcium (mg/dL)  Date Value  50/56/9794 7.9 (L)   Albumin (g/dL)  Date Value  80/16/5537 3.2 (L)   Phosphorus (mg/dL)  Date Value  48/27/0786 1.8 (L)   Sodium (mmol/L)  Date Value  04/02/2020 143   Corrected Ca: 8.54 mg/dL  Assessment: 74 y.o. female who presents to the ED because of concerns for abdominal pain, determined to be a perforated appendicitis8Day Post-Ops/p open appendectomy  MIVF: 0.9% NaCl w/ 20 mEq KCl/L at 75 mL/hr  Goal of Therapy:  Electrolytes WNL  Plan:   Continue 0.9% NaCl w/ 20 mEq KCl/L at 75 mL/hr  10 mEq IV KCl x 4 this morning per B Jon Billings, NP  15 mmol IV potassium phosphate (this provides 22 mEq IV potassium)  Re-check electrolytes in am  Lowella Bandy ,PharmD Clinical Pharmacist 04/02/2020 1:24 PM

## 2020-04-02 NOTE — Progress Notes (Signed)
PT Cancellation Note  Patient Details Name: Sonia Ochoa MRN: [id number] DOB: [birth year]   Cancelled Treatment:    Reason Eval/Treat Not Completed: Other (comment). Chart reviewed this AM, Patient noted with critically low K+ (2.5) contraindicated for exertional activity at this time.  Will continue efforts next date pending medical stability and appropriateness.  Olga Coaster PT, DPT 11:05 AM,04/02/20

## 2020-04-02 NOTE — Care Management Important Message (Signed)
Important Message  Patient Details  Name: Sonia Ochoa MRN: [id number] Date of Birth: 1946-07-20   Medicare Important Message Given:  Yes     [name] [name] 04/02/2020, 1:24 PM

## 2020-04-02 NOTE — Progress Notes (Signed)
SURGICAL PROGRESS NOTE   Hospital Day(s): 9.   Post op day(s): 9 Days Post-Op.   Interval History: Patient seen and examined, no acute events or new complaints overnight. Patient reports feeling okay.  There has been no worsening pain.  There has been no fever or chills.  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  Denies any nausea or vomiting.  Report having bowel movement but denies passing gas.  Vital signs in last 24 hours: [min-max] current  Temp:  [98.2 F (36.8 C)-98.7 F (37.1 C)] 98.2 F (36.8 C) (08/02 0415) Pulse Rate:  [83-91] 89 (08/02 1227) Resp:  [20] 20 (08/02 0415) BP: (148-162)/(71-77) 148/76 (08/02 1227) SpO2:  [96 %-100 %] 100 % (08/02 1227) Weight:  [67.7 kg] 67.7 kg (08/02 1120)     Height: 5\' 2"  (157.5 cm) Weight: 67.7 kg BMI (Calculated): 27.3   Physical Exam:  Constitutional: alert, cooperative and no distress  Respiratory: breathing non-labored at rest  Cardiovascular: regular rate and sinus rhythm  Gastrointestinal: soft, non-tender, and non-distended. Midline wound dry and clean.   Labs:  CBC Latest Ref Rng & Units 04/02/2020 03/31/2020 03/28/2020  WBC 4.0 - 10.5 K/uL 20.3(H) 17.2(H) 15.8(H)  Hemoglobin 12.0 - 15.0 g/dL 11.9(L) 11.2(L) 10.8(L)  Hematocrit 36 - 46 % 33.8(L) 33.2(L) 30.9(L)  Platelets 150 - 400 K/uL 516(H) 452(H) 299   CMP Latest Ref Rng & Units 04/02/2020 03/31/2020 03/28/2020  Glucose 70 - 99 mg/dL 03/30/2020) 95 299(B)  BUN 8 - 23 mg/dL 9 12 11   Creatinine 0.44 - 1.00 mg/dL 716(R 6.78  Sodium 135 - 145 mmol/L 143 141 139  Potassium 3.5 - 5.1 mmol/L 2.5(LL) 2.9(L) 3.1(L)  Chloride 98 - 111 mmol/L 104 101 103  CO2 22 - 32 mmol/L 26 26 30   Calcium 8.9 - 10.3 mg/dL 7.9(L) 8.0(L) 7.9(L)  Total Protein 6.5 - 8.1 g/dL - - -  Total Bilirubin 0.3 - 1.2 mg/dL - - -  Alkaline Phos 38 - 126 U/L - - -  AST 15 - 41 U/L - - -  ALT 0 - 44 U/L - - -    Imaging studies: No new pertinent imaging  studies   Assessment/Plan:  74 y.o.femalewith acute perforated appendicitis9Day Post-Ops/p open appendectomy, complicated by pertinent comorbidities includingGERD.  Patient again continue without any clinical deterioration.  There has been no fever in the last 48 hours.  Patient today refer not passing gas but having bowel movement.  Her abdomen is soft and nondistended.  She does still has an increasing white blood cell count.  We will repeat CT scan of the abdomen tomorrow 5 days from previous CT scan to assess any changes of the intra-abdominal abscess.  We will continue with current IV antibiotic therapy.  We will continue with current management.  Encouraged to ambulate.  1.01, MD

## 2020-04-02 NOTE — Progress Notes (Signed)
Notified NP Manuela Schwartz regarding patient's potassium level of 2.5. Orders were placed for 6 runs of IV potassium.  Will continue to monitor.  Arturo Morton

## 2020-04-03 ENCOUNTER — Inpatient Hospital Stay: Payer: Medicare Other

## 2020-04-03 ENCOUNTER — Encounter: Payer: Self-pay | Admitting: General Surgery

## 2020-04-03 LAB — BASIC METABOLIC PANEL
Anion gap: 10 (ref 5–15)
BUN: 10 mg/dL (ref 8–23)
CO2: 24 mmol/L (ref 22–32)
Calcium: 7.9 mg/dL — ABNORMAL LOW (ref 8.9–10.3)
Chloride: 107 mmol/L (ref 98–111)
Creatinine, Ser: 0.51 mg/dL (ref 0.44–1.00)
GFR calc Af Amer: >60 mL/min (ref >60)
GFR calc non Af Amer: >60 mL/min (ref >60)
Glucose, Bld: 96 mg/dL (ref 70–99)
Potassium: 2.8 mmol/L — ABNORMAL LOW (ref 3.5–5.1)
Sodium: 141 mmol/L (ref 135–145)

## 2020-04-03 LAB — MAGNESIUM: Magnesium: 1.9 mg/dL (ref 1.7–2.4)

## 2020-04-03 LAB — PHOSPHORUS: Phosphorus: 2.3 mg/dL — ABNORMAL LOW (ref 2.5–4.6)

## 2020-04-03 MED ORDER — IOHEXOL 9 MG/ML PO SOLN
500.0000 mL | ORAL | Status: AC
  Administered 2020-04-03 (×2): 500 mL via ORAL

## 2020-04-03 MED ORDER — POTASSIUM CHLORIDE IN NACL 40-0.9 MEQ/L-% IV SOLN
INTRAVENOUS | Status: DC
  Filled 2020-04-03 (×7): qty 1000

## 2020-04-03 MED ORDER — IOHEXOL 300 MG/ML  SOLN
100.0000 mL | Freq: Once | INTRAMUSCULAR | Status: AC | PRN
  Administered 2020-04-03: 100 mL via INTRAVENOUS

## 2020-04-03 MED ORDER — K PHOS MONO-SOD PHOS DI & MONO 155-852-130 MG PO TABS
500.0000 mg | ORAL_TABLET | Freq: Once | ORAL | Status: AC
  Administered 2020-04-03: 500 mg via ORAL
  Filled 2020-04-03: qty 2

## 2020-04-03 NOTE — Progress Notes (Signed)
SURGICAL PROGRESS NOTE   Hospital Day(s): 10.   Post op day(s): 10 Days Post-Op.   Interval History: Patient seen and examined, no acute events or new complaints overnight. Patient reports feeling okay.  She denies any significant pain.  Denies nausea or vomiting.  Report passing gas and having bowel movement.  Vital signs in last 24 hours: [min-max] current  Temp:  [97.8 F (36.6 C)-98.2 F (36.8 C)] 98.2 F (36.8 C) (08/03 1228) Pulse Rate:  [81-88] 81 (08/03 1228) Resp:  [16-20] 16 (08/03 1228) BP: (149-164)/(76-83) 150/82 (08/03 1228) SpO2:  [96 %-97 %] 97 % (08/03 1228)     Height: 5\' 2"  (157.5 cm) Weight: 67.7 kg BMI (Calculated): 27.3   Physical Exam:  Constitutional: alert, cooperative and no distress  Respiratory: breathing non-labored at rest  Cardiovascular: regular rate and sinus rhythm  Gastrointestinal: soft, non-tender, and non-distended.  Wound is dry and clean.  Labs:  CBC Latest Ref Rng & Units 04/02/2020 03/31/2020 03/28/2020  WBC 4.0 - 10.5 K/uL 20.3(H) 17.2(H) 15.8(H)  Hemoglobin 12.0 - 15.0 g/dL 11.9(L) 11.2(L) 10.8(L)  Hematocrit 36 - 46 % 33.8(L) 33.2(L) 30.9(L)  Platelets 150 - 400 K/uL 516(H) 452(H) 299   CMP Latest Ref Rng & Units 04/03/2020 04/02/2020 03/31/2020  Glucose 70 - 99 mg/dL 96 04/02/2020) 95  BUN 8 - 23 mg/dL 10 9 12   Creatinine 0.44 - 1.00 mg/dL 616(W 7.37  Sodium 135 - 145 mmol/L 141 143 141  Potassium 3.5 - 5.1 mmol/L 2.8(L) 2.5(LL) 2.9(L)  Chloride 98 - 111 mmol/L 107 104 101  CO2 22 - 32 mmol/L 24 26 26   Calcium 8.9 - 10.3 mg/dL 7.9(L) 7.9(L) 8.0(L)  Total Protein 6.5 - 8.1 g/dL - - -  Total Bilirubin 0.3 - 1.2 mg/dL - - -  Alkaline Phos 38 - 126 U/L - - -  AST 15 - 41 U/L - - -  ALT 0 - 44 U/L - - -    Imaging studies: I personally evaluated the CT scan of the abdominal pelvis.  There is a more localized abscess but no increase in size.  No free fluid.   Assessment/Plan:  74 y.o.femalewith acute perforated appendicitis10Day  Post-Ops/p open appendectomy, complicated by pertinent comorbidities includingGERD.  Patient without any clinical deterioration.  Continue intra-abdominal abscess.  No fever.  We will continue IV antibiotic therapy.  I will continue electrolyte replacement.  I encouraged the patient to ambulate.  Will clamp NGT and if she does not develop nausea will consider starting clear liquid tomorrow.  I encouraged the patient to ambulate.  2.69, MD

## 2020-04-03 NOTE — Consult Note (Signed)
PHARMACY CONSULT NOTE  Pharmacy Consult for Electrolyte Monitoring and Replacement   Recent Labs: Potassium (mmol/L)  Date Value  04/03/2020 2.8 (L)   Magnesium (mg/dL)  Date Value  68/34/1962 1.9   Calcium (mg/dL)  Date Value  22/97/9892 7.9 (L)   Albumin (g/dL)  Date Value  11/94/1740 3.2 (L)   Phosphorus (mg/dL)  Date Value  81/44/8185 2.3 (L)   Sodium (mmol/L)  Date Value  04/03/2020 141   Corrected Ca: 8.54 mg/dL  Assessment: 74 y.o. female who presents to the ED because of concerns for abdominal pain, determined to be a perforated appendicitis10 days Post-Ops/p open appendectomy  MIVF: 0.9% NaCl w/ 20 mEq KCl/L at 75 mL/hr  Goal of Therapy:  Electrolytes WNL  Plan:   Change MIVF to 0.9% NaCl w/ 40 mEq KCl/L at 75 mL/hr  K-Phos neutral 500 mg po x 1  Re-check electrolytes in am  Lowella Bandy ,PharmD Clinical Pharmacist 04/03/2020 7:19 AM

## 2020-04-03 NOTE — TOC Progression Note (Signed)
Transition of Care [facility]) - Progression Note    Patient Details  Name: Sonia Ochoa MRN: [id number] Date of Birth: [birth year]  Transition of Care [facility]) CM/SW [place], LCSW Phone Number: 04/03/2020, 10:19 AM  Clinical Narrative: CSW met with patient and confirmed she does not have a PCP. She says she saw a provider named [place] a couple of years ago but does not remember her last name or the name of the clinic she went to. CSW unable to find documentation of visit in her chart. Patient agreeable to getting set up with a new provider. No preference on clinic but she does prefer a female provider. Will make appointment once we know when she's discharging.    Expected Discharge Plan and Services                                                 Social Determinants of Health (SDOH) Interventions    Readmission Risk Interventions No flowsheet data found.

## 2020-04-03 NOTE — Progress Notes (Signed)
Physical Therapy Treatment Patient Details Name: Sonia Ochoa MRN: [id number] DOB: March 17, 1946 Today's Date: 04/03/2020    History of Present Illness 74 y.o. female presented to [facility] ED for evaluation of abdominal pain. Patient reports started having abdominal pain 4 days ago. PMH includes: RSD, hyperlipidemia, GERD. S/P open appendectomy.    PT Comments    Pt alert, agreeable to PT with encouragement (referenced R drain, stated it has pulled out a bit over night, agreeable to mobilize after RN consent). Referenced R flank pain throughout, RN notified. The patient demonstrated modI bed mobility, and sit <> stand with CGA/supervision. The patient was able to ambulate ~123ft without AD, but very anxious and became SOB upon returning to room but reported she felt fine besides R flank pain. The patient would benefit from further skilled PT intervention to continue to progress towards goals. Recommendation remains appropriate.       Follow Up Recommendations  Home health PT     Equipment Recommendations  None recommended by PT    Recommendations for Other Services       Precautions / Restrictions Precautions Precautions: Fall Restrictions Weight Bearing Restrictions: No    Mobility  Bed Mobility Overal bed mobility: Modified Independent             General bed mobility comments: increased time use of rails, no physical assist provided. Cues for log rolling to minimize pain  Transfers Overall transfer level: Needs assistance Equipment used: None Transfers: Sit to/from Stand Sit to Stand: Min guard;Supervision            Ambulation/Gait Ambulation/Gait assistance: Min guard Gait Distance (Feet): 150 Feet Assistive device: None Gait Pattern/deviations: Step-through pattern;Decreased stride length Gait velocity: decreased   General Gait Details: pt wanted to attempt ambulating without RW. able to do, but anxious throughout and became SOB by the time she  returned to her room.   Stairs             Wheelchair Mobility    Modified Rankin (Stroke Patients Only)       Balance Overall balance assessment: Mild deficits observed, not formally tested                                          Cognition Arousal/Alertness: Awake/alert Behavior During Therapy: WFL for tasks assessed/performed Overall Cognitive Status: Within Functional Limits for tasks assessed                                        Exercises Other Exercises Other Exercises: extended time needed for RN to cap NG tube, extra gown donning, sock donning at EOB (pt performed with supervision).    General Comments        Pertinent Vitals/Pain Pain Assessment: Faces Faces Pain Scale: Hurts little more Pain Location: abdomen/ R flank Pain Descriptors / Indicators: Sore Pain Intervention(s): Limited activity within patient's tolerance;Monitored during session;Repositioned    Home Living                      Prior Function            PT Goals (current goals can now be found in the care plan section) Progress towards PT goals: Progressing toward goals    Frequency    Min 2X/week  PT Plan Current plan remains appropriate    Co-evaluation              AM-PAC PT "6 Clicks" Mobility   Outcome Measure  Help needed turning from your back to your side while in a flat bed without using bedrails?: None Help needed moving from lying on your back to sitting on the side of a flat bed without using bedrails?: None Help needed moving to and from a bed to a chair (including a wheelchair)?: None Help needed standing up from a chair using your arms (e.g., wheelchair or bedside chair)?: A Little Help needed to walk in hospital room?: A Little Help needed climbing 3-5 steps with a railing? : A Little 6 Click Score: 21    End of Session Equipment Utilized During Treatment: Gait belt Activity Tolerance: Patient  tolerated treatment well Patient left: in bed;with call bell/phone within reach;with bed alarm set;with SCD's reapplied Nurse Communication: Mobility status PT Visit Diagnosis: Muscle weakness (generalized) (M62.81);Difficulty in walking, not elsewhere classified (R26.2);Pain Pain - Right/Left: Right Pain - part of body:  (abdomen)     Time: 9758-8325 PT Time Calculation (min) (ACUTE ONLY): 26 min  Charges:  $Gait Training: 8-22 mins $Therapeutic Exercise: 8-22 mins                     Olga Coaster PT, DPT 3:46 PM,04/03/20

## 2020-04-04 LAB — BASIC METABOLIC PANEL
Anion gap: 14 (ref 5–15)
BUN: 7 mg/dL — ABNORMAL LOW (ref 8–23)
CO2: 20 mmol/L — ABNORMAL LOW (ref 22–32)
Calcium: 7.9 mg/dL — ABNORMAL LOW (ref 8.9–10.3)
Chloride: 107 mmol/L (ref 98–111)
Creatinine, Ser: 0.59 mg/dL (ref 0.44–1.00)
GFR calc Af Amer: >60 mL/min (ref >60)
GFR calc non Af Amer: >60 mL/min (ref >60)
Glucose, Bld: 102 mg/dL — ABNORMAL HIGH (ref 70–99)
Potassium: 2.4 mmol/L — CL (ref 3.5–5.1)
Sodium: 141 mmol/L (ref 135–145)

## 2020-04-04 LAB — PHOSPHORUS: Phosphorus: 2.1 mg/dL — ABNORMAL LOW (ref 2.5–4.6)

## 2020-04-04 LAB — MAGNESIUM: Magnesium: 1.9 mg/dL (ref 1.7–2.4)

## 2020-04-04 MED ORDER — K PHOS MONO-SOD PHOS DI & MONO 155-852-130 MG PO TABS
500.0000 mg | ORAL_TABLET | Freq: Three times a day (TID) | ORAL | Status: AC
  Administered 2020-04-04 (×3): 500 mg via ORAL
  Filled 2020-04-04 (×3): qty 2

## 2020-04-04 MED ORDER — POTASSIUM CHLORIDE 10 MEQ/100ML IV SOLN
10.0000 meq | INTRAVENOUS | Status: AC
  Administered 2020-04-04 (×4): 10 meq via INTRAVENOUS
  Filled 2020-04-04 (×4): qty 100

## 2020-04-04 NOTE — Consult Note (Signed)
PHARMACY CONSULT NOTE  Pharmacy Consult for Electrolyte Monitoring and Replacement   Recent Labs: Potassium (mmol/L)  Date Value  04/04/2020 2.4 (LL)   Magnesium (mg/dL)  Date Value  39/76/7341 1.9   Calcium (mg/dL)  Date Value  93/79/0240 7.9 (L)   Albumin (g/dL)  Date Value  97/35/3299 3.2 (L)   Phosphorus (mg/dL)  Date Value  24/26/8341 2.1 (L)   Sodium (mmol/L)  Date Value  04/04/2020 141   Corrected Ca: 8.54 mg/dL  Assessment: 74 y.o. female who presents to the ED because of concerns for abdominal pain, determined to be a perforated appendicitis10 days Post-Ops/p open appendectomy. Her potassium remains refractory to aggressive replacement thus far.  MIVF: 0.9% NaCl w/ 40 mEq KCl/L at 75 mL/hr  Goal of Therapy:  Electrolytes WNL  Plan:   continue MIVF 0.9% NaCl w/ 40 mEq KCl/L at 75 mL/hr  IV KCl 10 mEq x 4  K-Phos neutral 500 mg po x 3  Re-check electrolytes in am  Lowella Bandy ,PharmD Clinical Pharmacist 04/04/2020 7:11 AM

## 2020-04-04 NOTE — Progress Notes (Signed)
SURGICAL PROGRESS NOTE   Hospital Day(s): 11.   Post op day(s): 11 Days Post-Op.   Interval History: Patient seen and examined, no acute events or new complaints overnight. Patient reports feeling well.  She reported she continue passing gas.  She denies any nausea despite having the NGT clamped.  Vital signs in last 24 hours: [min-max] current  Temp:  [97.8 F (36.6 C)-98.4 F (36.9 C)] 98.4 F (36.9 C) (08/04 1144) Pulse Rate:  [86-100] 86 (08/04 1144) Resp:  [16-20] 16 (08/04 1144) BP: (143-164)/(82-85) 143/85 (08/04 1144) SpO2:  [95 %-97 %] 97 % (08/04 1144)     Height: 5\' 2"  (157.5 cm) Weight: 67.7 kg BMI (Calculated): 27.3   Physical Exam:  Constitutional: alert, cooperative and no distress  Respiratory: breathing non-labored at rest  Cardiovascular: regular rate and sinus rhythm  Gastrointestinal: soft, non-tender, and non-distended. Wound is dry and clean.   Labs:  CBC Latest Ref Rng & Units 04/02/2020 03/31/2020 03/28/2020  WBC 4.0 - 10.5 K/uL 20.3(H) 17.2(H) 15.8(H)  Hemoglobin 12.0 - 15.0 g/dL 11.9(L) 11.2(L) 10.8(L)  Hematocrit 36 - 46 % 33.8(L) 33.2(L) 30.9(L)  Platelets 150 - 400 K/uL 516(H) 452(H) 299   CMP Latest Ref Rng & Units 04/04/2020 04/03/2020 04/02/2020  Glucose 70 - 99 mg/dL 06/02/2020) 96 211(Z)  BUN 8 - 23 mg/dL 7(L) 10 9  Creatinine 735(A - 1.00 mg/dL 7.01 4.10 3.01  Sodium 135 - 145 mmol/L 141 141 143  Potassium 3.5 - 5.1 mmol/L 2.4(LL) 2.8(L) 2.5(LL)  Chloride 98 - 111 mmol/L 107 107 104  CO2 22 - 32 mmol/L 20(L) 24 26  Calcium 8.9 - 10.3 mg/dL 7.9(L) 7.9(L) 7.9(L)  Total Protein 6.5 - 8.1 g/dL - - -  Total Bilirubin 0.3 - 1.2 mg/dL - - -  Alkaline Phos 38 - 126 U/L - - -  AST 15 - 41 U/L - - -  ALT 0 - 44 U/L - - -    Imaging studies: No new pertinent imaging studies   Assessment/Plan:  74 y.o.femalewith acute perforated appendicitis11Day Post-Ops/p open appendectomy, complicated by pertinent comorbidities includingGERD.  Patient without  any clinical deterioration.  No fever.  No nausea or vomiting despite having the NGT clamped.  Will start clear liquid antacid for toleration.  Continue with Zosyn and Flagyl as IV antibiotic therapy for intra-abdominal abscess.  Encourage patient to ambulate.  We will continue with DVT prophylaxis.  66, MD

## 2020-04-04 NOTE — Progress Notes (Signed)
Abnormal Potassium of 2.4, informed Dr. Hazle Quant; response Pharmacy replacing

## 2020-04-05 LAB — GLUCOSE, CAPILLARY: Glucose-Capillary: 83 mg/dL (ref 70–99)

## 2020-04-05 LAB — BASIC METABOLIC PANEL
Anion gap: 13 (ref 5–15)
BUN: <5 mg/dL — ABNORMAL LOW (ref 8–23)
CO2: 20 mmol/L — ABNORMAL LOW (ref 22–32)
Calcium: 8.2 mg/dL — ABNORMAL LOW (ref 8.9–10.3)
Chloride: 108 mmol/L (ref 98–111)
Creatinine, Ser: 0.51 mg/dL (ref 0.44–1.00)
GFR calc Af Amer: >60 mL/min (ref >60)
GFR calc non Af Amer: >60 mL/min (ref >60)
Glucose, Bld: 95 mg/dL (ref 70–99)
Potassium: 2.9 mmol/L — ABNORMAL LOW (ref 3.5–5.1)
Sodium: 141 mmol/L (ref 135–145)

## 2020-04-05 LAB — MAGNESIUM: Magnesium: 1.7 mg/dL (ref 1.7–2.4)

## 2020-04-05 LAB — PHOSPHORUS: Phosphorus: 2.6 mg/dL (ref 2.5–4.6)

## 2020-04-05 MED ORDER — POTASSIUM CHLORIDE CRYS ER 20 MEQ PO TBCR
20.0000 meq | EXTENDED_RELEASE_TABLET | ORAL | Status: AC
  Administered 2020-04-05 (×2): 20 meq via ORAL
  Filled 2020-04-05 (×2): qty 1

## 2020-04-05 MED ORDER — ENSURE MAX PROTEIN PO LIQD
11.0000 [oz_av] | Freq: Two times a day (BID) | ORAL | Status: DC
  Administered 2020-04-06 – 2020-04-08 (×3): 11 [oz_av] via ORAL
  Filled 2020-04-05: qty 330

## 2020-04-05 MED ORDER — MAGNESIUM SULFATE IN D5W 1-5 GM/100ML-% IV SOLN
1.0000 g | Freq: Once | INTRAVENOUS | Status: AC
  Administered 2020-04-05: 1 g via INTRAVENOUS
  Filled 2020-04-05: qty 100

## 2020-04-05 MED ORDER — POTASSIUM CHLORIDE 10 MEQ/100ML IV SOLN
10.0000 meq | INTRAVENOUS | Status: DC
  Filled 2020-04-05: qty 100

## 2020-04-05 NOTE — Care Management Important Message (Signed)
Important Message  Patient Details  Name: Sonia Ochoa MRN: [id number] Date of Birth: Aug 23, 1946   Medicare Important Message Given:  Yes     [name] A Rosan Calbert 04/05/2020, 11:42 AM

## 2020-04-05 NOTE — Progress Notes (Signed)
PHARMACY CONSULT NOTE - FOLLOW UP  Pharmacy Consult for Electrolyte Monitoring and Replacement   Recent Labs: Potassium (mmol/L)  Date Value  04/05/2020 2.9 (L)   Magnesium (mg/dL)  Date Value  73/41/9379 1.7   Calcium (mg/dL)  Date Value  02/40/9735 8.2 (L)   Albumin (g/dL)  Date Value  32/99/2426 3.2 (L)   Phosphorus (mg/dL)  Date Value  83/41/9622 2.6   Sodium (mmol/L)  Date Value  04/05/2020 141     Assessment: Pharmacy was consulted for electrolyte monitoring and replacement for a 74 yo female with a perforated appendicitis 10 days post-op s/p open appendectomy. Her potassium remains refractory to aggressive replacement thus far.  Phosphorous is now therapeutic at 2.6 Mg 1.9>>1.7 K 2.4>>2.9   Goal of Therapy:  Electrolytes WNL  Plan:  Continue 0.9% NaCl with KCl 43mEq/L infusion IV KCl 10 mEq x4 IV Mg sulfate 1g x 1 Discontinue KPhos 500 mg TID Re-check electrolytes tomorrow AM   Raiford Noble, PharmD Pharmacy Resident  04/05/2020 7:39 AM

## 2020-04-05 NOTE — Progress Notes (Signed)
SURGICAL PROGRESS NOTE   Hospital Day(s): 12.   Post op day(s): 12 Days Post-Op.   Interval History: Patient seen and examined, no acute events or new complaints overnight. Patient reports feeling well today.  She denies any abdominal pain.  She reports she has been passing gas and having bowel movement.  Denies any fever or chills.  Denies any abdominal pain at this moment.  Vital signs in last 24 hours: [min-max] current  Temp:  [97.5 F (36.4 C)-98.4 F (36.9 C)] 98.2 F (36.8 C) (08/05 0357) Pulse Rate:  [86-90] 87 (08/05 0357) Resp:  [16-20] 20 (08/05 0357) BP: (143-169)/(81-87) 165/81 (08/05 0357) SpO2:  [95 %-98 %] 95 % (08/05 0357)     Height: 5\' 2"  (157.5 cm) Weight: 67.7 kg BMI (Calculated): 27.3   Physical Exam:  Constitutional: alert, cooperative and no distress  Respiratory: breathing non-labored at rest  Cardiovascular: regular rate and sinus rhythm  Gastrointestinal: soft, non-tender, and non-distended.  Wound is dry and clean.  Labs:  CBC Latest Ref Rng & Units 04/02/2020 03/31/2020 03/28/2020  WBC 4.0 - 10.5 K/uL 20.3(H) 17.2(H) 15.8(H)  Hemoglobin 12.0 - 15.0 g/dL 11.9(L) 11.2(L) 10.8(L)  Hematocrit 36 - 46 % 33.8(L) 33.2(L) 30.9(L)  Platelets 150 - 400 K/uL 516(H) 452(H) 299   CMP Latest Ref Rng & Units 04/05/2020 04/04/2020 04/03/2020  Glucose 70 - 99 mg/dL 95 06/03/2020) 96  BUN 8 - 23 mg/dL 793(J) 7(L) 10  Creatinine 0.44 - 1.00 mg/dL <0(Z 0.09 2.33  Sodium 135 - 145 mmol/L 141 141 141  Potassium 3.5 - 5.1 mmol/L 2.9(L) 2.4(LL) 2.8(L)  Chloride 98 - 111 mmol/L 108 107 107  CO2 22 - 32 mmol/L 20(L) 20(L) 24  Calcium 8.9 - 10.3 mg/dL 8.2(L) 7.9(L) 7.9(L)  Total Protein 6.5 - 8.1 g/dL - - -  Total Bilirubin 0.3 - 1.2 mg/dL - - -  Alkaline Phos 38 - 126 U/L - - -  AST 15 - 41 U/L - - -  ALT 0 - 44 U/L - - -    Imaging studies: No new pertinent imaging studies   Assessment/Plan:  74 y.o.femalewith acute perforated appendicitis12Day Post-Ops/p open  appendectomy, complicated by pertinent comorbidities includingGERD.  Patient without clinical deterioration.  The NG tube has been clamped for more than 24 hours and she continued having bowel movement and passing gas without any nausea.  She tolerated clear liquids without any nausea.  I will advance her diet to full liquids and add protein supplements.  There has been no fever or tachycardia.  I will continue with antibiotic therapy.  Patient encouraged to ambulate.  We will repeat CBC in a.m. for white blood cell trend evaluation.  We will also continue replacing electrolytes as needed.  74, MD

## 2020-04-05 NOTE — Progress Notes (Signed)
Physical Therapy Treatment Patient Details Name: Sonia Ochoa MRN: [id number] DOB: 1945/12/19 Today's Date: 04/05/2020    History of Present Illness 74 y.o. female presented to [facility] ED for evaluation of abdominal pain. Patient reports started having abdominal pain 4 days ago. PMH includes: RSD, hyperlipidemia, GERD. S/P open appendectomy.    PT Comments    Ready for session.  K+ 2.9 and trending up.  Pt ready for session wanting to walk.  Has been getting up to commode on her own and doing her HEP.  She is able to complete 1 lap around unit with min guard.  Somewhat unsteady at times but able to self correct imbalances.  Reports feeling comfortable with gait with minimal fatigue noted today.  Elects to return to supine with LE's elevated after session.  Encouraged continued HEP and pt given education on further exercises.  Voiced understanding.   Follow Up Recommendations  Home health PT     Equipment Recommendations  None recommended by PT    Recommendations for Other Services       Precautions / Restrictions Precautions Precautions: Fall Restrictions Other Position/Activity Restrictions: watch K+    Mobility  Bed Mobility Overal bed mobility: Modified Independent                Transfers Overall transfer level: Modified independent Equipment used: None Transfers: Sit to/from Stand Sit to Stand: Modified independent (Device/Increase time)            Ambulation/Gait Ambulation/Gait assistance: Min guard Gait Distance (Feet): 170 Feet Assistive device: None Gait Pattern/deviations: Step-through pattern;Decreased stride length Gait velocity: decreased   General Gait Details: somewhat unsteady without AD but able to self correct imbalances and reported feeling good with gait today.  minimal fatigue/SOB noted today.   Stairs             Wheelchair Mobility    Modified Rankin (Stroke Patients Only)       Balance Overall balance  assessment: Mild deficits observed, not formally tested                                          Cognition Arousal/Alertness: Awake/alert Behavior During Therapy: WFL for tasks assessed/performed Overall Cognitive Status: Within Functional Limits for tasks assessed                                        Exercises Other Exercises Other Exercises: verbal review for HEP and further education.  Pt reports doing on her own in the room daily,.    General Comments        Pertinent Vitals/Pain Pain Assessment: No/denies pain    Home Living                      Prior Function            PT Goals (current goals can now be found in the care plan section) Progress towards PT goals: Progressing toward goals    Frequency    Min 2X/week      PT Plan Current plan remains appropriate    Co-evaluation              AM-PAC PT "6 Clicks" Mobility   Outcome Measure  Help needed turning from your back to your side while  in a flat bed without using bedrails?: None Help needed moving from lying on your back to sitting on the side of a flat bed without using bedrails?: None Help needed moving to and from a bed to a chair (including a wheelchair)?: None Help needed standing up from a chair using your arms (e.g., wheelchair or bedside chair)?: None Help needed to walk in hospital room?: A Little Help needed climbing 3-5 steps with a railing? : A Little 6 Click Score: 22    End of Session Equipment Utilized During Treatment: Gait belt Activity Tolerance: Patient tolerated treatment well Patient left: in bed;with call bell/phone within reach Nurse Communication: Mobility status Pain - Right/Left: Right     Time: 7893-8101 PT Time Calculation (min) (ACUTE ONLY): 15 min  Charges:  $Gait Training: 8-22 mins                    Danielle Dess, PTA 04/05/20, 9:51 AM

## 2020-04-06 LAB — BASIC METABOLIC PANEL
Anion gap: 7 (ref 5–15)
BUN: <5 mg/dL — ABNORMAL LOW (ref 8–23)
CO2: 22 mmol/L (ref 22–32)
Calcium: 8.4 mg/dL — ABNORMAL LOW (ref 8.9–10.3)
Chloride: 113 mmol/L — ABNORMAL HIGH (ref 98–111)
Creatinine, Ser: 0.64 mg/dL (ref 0.44–1.00)
GFR calc Af Amer: >60 mL/min (ref >60)
GFR calc non Af Amer: >60 mL/min (ref >60)
Glucose, Bld: 139 mg/dL — ABNORMAL HIGH (ref 70–99)
Potassium: 3.5 mmol/L (ref 3.5–5.1)
Sodium: 142 mmol/L (ref 135–145)

## 2020-04-06 LAB — CBC WITH DIFFERENTIAL/PLATELET
Abs Immature Granulocytes: 0.14 10*3/uL — ABNORMAL HIGH (ref 0.00–0.07)
Basophils Absolute: 0.1 10*3/uL (ref 0.0–0.1)
Basophils Relative: 1 %
Eosinophils Absolute: 0.1 10*3/uL (ref 0.0–0.5)
Eosinophils Relative: 1 %
HCT: 31.1 % — ABNORMAL LOW (ref 36.0–46.0)
Hemoglobin: 10.9 g/dL — ABNORMAL LOW (ref 12.0–15.0)
Immature Granulocytes: 1 %
Lymphocytes Relative: 22 %
Lymphs Abs: 3.5 10*3/uL (ref 0.7–4.0)
MCH: 31.3 pg (ref 26.0–34.0)
MCHC: 35 g/dL (ref 30.0–36.0)
MCV: 89.4 fL (ref 80.0–100.0)
Monocytes Absolute: 1.3 10*3/uL — ABNORMAL HIGH (ref 0.1–1.0)
Monocytes Relative: 8 %
Neutro Abs: 10.5 10*3/uL — ABNORMAL HIGH (ref 1.7–7.7)
Neutrophils Relative %: 67 %
Platelets: 760 10*3/uL — ABNORMAL HIGH (ref 150–400)
RBC: 3.48 MIL/uL — ABNORMAL LOW (ref 3.87–5.11)
RDW: 15.1 % (ref 11.5–15.5)
WBC: 15.7 10*3/uL — ABNORMAL HIGH (ref 4.0–10.5)
nRBC: 0 % (ref 0.0–0.2)

## 2020-04-06 LAB — MAGNESIUM: Magnesium: 1.9 mg/dL (ref 1.7–2.4)

## 2020-04-06 LAB — PHOSPHORUS: Phosphorus: 2.1 mg/dL — ABNORMAL LOW (ref 2.5–4.6)

## 2020-04-06 MED ORDER — VITAMIN D3 1.25 MG (50000 UT) PO CAPS
10000.0000 ug | ORAL_CAPSULE | Freq: Every day | ORAL | Status: DC
  Administered 2020-04-06: 10000 ug via ORAL
  Filled 2020-04-06 (×2): qty 1

## 2020-04-06 MED ORDER — ADULT MULTIVITAMIN W/MINERALS CH
1.0000 | ORAL_TABLET | Freq: Every day | ORAL | Status: DC
  Administered 2020-04-06: 1 via ORAL
  Filled 2020-04-06 (×2): qty 1

## 2020-04-06 MED ORDER — K PHOS MONO-SOD PHOS DI & MONO 155-852-130 MG PO TABS
500.0000 mg | ORAL_TABLET | ORAL | Status: AC
  Administered 2020-04-06 (×4): 500 mg via ORAL
  Filled 2020-04-06 (×4): qty 2

## 2020-04-06 MED ORDER — VIT C-CHOLECALCIFEROL-ROSE HIP 500-1000-20 MG-UNIT-MG PO CAPS
1.0000 | ORAL_CAPSULE | Freq: Every day | ORAL | Status: DC
  Administered 2020-04-06: 1 via ORAL
  Filled 2020-04-06 (×2): qty 1

## 2020-04-06 MED ORDER — TAB-A-VITE/IRON PO TABS
1.0000 | ORAL_TABLET | Freq: Every day | ORAL | Status: DC
  Administered 2020-04-06: 1 via ORAL
  Filled 2020-04-06 (×2): qty 1

## 2020-04-06 MED ORDER — NONFORMULARY OR COMPOUNDED ITEM
1000.0000 ug | Freq: Every day | Status: DC
  Administered 2020-04-06: 1000 ug via ORAL
  Filled 2020-04-06 (×2): qty 1

## 2020-04-06 MED ORDER — BETA CAROTENE 25000 UNITS PO CAPS
25000.0000 [IU] | ORAL_CAPSULE | Freq: Every day | ORAL | Status: DC
  Administered 2020-04-06: 25000 [IU] via ORAL
  Filled 2020-04-06 (×2): qty 1

## 2020-04-06 MED ORDER — MAGNESIUM SULFATE IN D5W 1-5 GM/100ML-% IV SOLN
1.0000 g | Freq: Once | INTRAVENOUS | Status: AC
  Administered 2020-04-06: 1 g via INTRAVENOUS
  Filled 2020-04-06: qty 100

## 2020-04-06 NOTE — Progress Notes (Addendum)
PHARMACY CONSULT NOTE - FOLLOW UP  Pharmacy Consult for Electrolyte Monitoring and Replacement   Recent Labs: Potassium (mmol/L)  Date Value  04/06/2020 3.5   Magnesium (mg/dL)  Date Value  62/22/9798 1.9   Calcium (mg/dL)  Date Value  92/07/9416 8.4 (L)   Albumin (g/dL)  Date Value  40/81/4481 3.2 (L)   Phosphorus (mg/dL)  Date Value  85/63/1497 2.1 (L)   Sodium (mmol/L)  Date Value  04/06/2020 142     Assessment: Pharmacy was consulted for electrolyte monitoring and replacement for a 74 yo female with a perforated appendicitis 10 days post-op s/p open appendectomy. Her potassium has been refractory to aggressive replacement and has increased from 2.9>>3.5.  Phosphorous was therapeutic at 2.6 on 8/5 but has fallen to 2.1 this AM (8/6)  Mg 1.9>>1.7>>1.9   Goal of Therapy:  Electrolytes WNL  Plan:  Continue 0.9% NaCl with KCl 60mEq/L infusion @ 75 ml/hr Will order IV Mg sulfate 1g x 1 Will order KPhos 500 mg q4h x4 doses Re-check electrolytes tomorrow AM and continue to replace as needed.   Raiford Noble, PharmD Pharmacy Resident  04/06/2020 7:02 AM

## 2020-04-06 NOTE — Progress Notes (Signed)
SURGICAL PROGRESS NOTE   Hospital Day(s): 13.   Post op day(s): 13 Days Post-Op.   Interval History: Patient seen and examined, no acute events or new complaints overnight. Patient reports feeling well.  She denies any nausea or vomiting with the full liquids.  She denies abdominal pain.  She denies any fever or chills.  Vital signs in last 24 hours: [min-max] current  Temp:  [97.8 F (36.6 C)-98.2 F (36.8 C)] 97.8 F (36.6 C) (08/06 1207) Pulse Rate:  [86-92] 86 (08/06 1207) Resp:  [18-20] 20 (08/06 0543) BP: (129-148)/(62-85) 130/85 (08/06 1207) SpO2:  [96 %-99 %] 99 % (08/06 1207)     Height: 5\' 2"  (157.5 cm) Weight: 67.7 kg BMI (Calculated): 27.3   Physical Exam:  Constitutional: alert, cooperative and no distress  Respiratory: breathing non-labored at rest  Cardiovascular: regular rate and sinus rhythm  Gastrointestinal: soft, non-tender, and non-distended  Labs:  CBC Latest Ref Rng & Units 04/06/2020 04/02/2020 03/31/2020  WBC 4.0 - 10.5 K/uL 15.7(H) 20.3(H) 17.2(H)  Hemoglobin 12.0 - 15.0 g/dL 10.9(L) 11.9(L) 11.2(L)  Hematocrit 36 - 46 % 31.1(L) 33.8(L) 33.2(L)  Platelets 150 - 400 K/uL 760(H) 516(H) 452(H)   CMP Latest Ref Rng & Units 04/06/2020 04/05/2020 04/04/2020  Glucose 70 - 99 mg/dL 06/04/2020) 95 092(Z)  BUN 8 - 23 mg/dL 300(T) <6(A) 7(L)  Creatinine 0.44 - 1.00 mg/dL <2(Q 3.33 5.45  Sodium 135 - 145 mmol/L 142 141 141  Potassium 3.5 - 5.1 mmol/L 3.5 2.9(L) 2.4(LL)  Chloride 98 - 111 mmol/L 113(H) 108 107  CO2 22 - 32 mmol/L 22 20(L) 20(L)  Calcium 8.9 - 10.3 mg/dL 6.25) 6.3(S) 7.9(L)  Total Protein 6.5 - 8.1 g/dL - - -  Total Bilirubin 0.3 - 1.2 mg/dL - - -  Alkaline Phos Sonia - 126 U/L - - -  AST 15 - 41 U/L - - -  ALT 0 - 44 U/L - - -    Imaging studies: No new pertinent imaging studies   Assessment/Plan:  74 Ochoa acute perforated appendicitis13Day Post-Ops/p open appendectomy, complicated by pertinent comorbidities includingGERD.  Patient  continue improving and recovering slowly.  Today without nausea with a full liquids.  She continue passing gas and having bowel movement.  There has been no fever or chills.  Finally there is a good decrease in white blood cell count.  This is consistent with patient improvement clinically.  I will advance her diet to soft diet and assess for toleration.  I encouraged the patient to ambulate.  I will continue with Zosyn and Flagyl as IV antibiotic therapy.  Electrolytes are better.  65, MD

## 2020-04-06 NOTE — Plan of Care (Signed)
Continuing with plan of care. 

## 2020-04-06 NOTE — TOC Progression Note (Signed)
Transition of Care [facility]) - Progression Note    Patient Details  Name: Kamora Vossler MRN: [id number] Date of Birth: [birth year]  Transition of Care [facility]) CM/SW Contact  Margarito Liner, LCSW Phone Number: 04/06/2020, 3:03 PM  Clinical Narrative:  Patient has an appt with Dr. Nemiah Commander at [facility] on 9/7 at 9:00 am. Patient is aware. Sent secure chat to Dr. Darnelle Catalan to see if she would cover her orders until her PCP appt. [facility] representative is aware.   Expected Discharge Plan and Services                                                 Social Determinants of Health (SDOH) Interventions    Readmission Risk Interventions No flowsheet data found.

## 2020-04-07 LAB — MAGNESIUM: Magnesium: 1.9 mg/dL (ref 1.7–2.4)

## 2020-04-07 LAB — BASIC METABOLIC PANEL
Anion gap: 7 (ref 5–15)
BUN: <5 mg/dL — ABNORMAL LOW (ref 8–23)
CO2: 24 mmol/L (ref 22–32)
Calcium: 8.2 mg/dL — ABNORMAL LOW (ref 8.9–10.3)
Chloride: 110 mmol/L (ref 98–111)
Creatinine, Ser: 0.56 mg/dL (ref 0.44–1.00)
GFR calc Af Amer: >60 mL/min (ref >60)
GFR calc non Af Amer: >60 mL/min (ref >60)
Glucose, Bld: 109 mg/dL — ABNORMAL HIGH (ref 70–99)
Potassium: 4 mmol/L (ref 3.5–5.1)
Sodium: 141 mmol/L (ref 135–145)

## 2020-04-07 LAB — PHOSPHORUS: Phosphorus: 3.4 mg/dL (ref 2.5–4.6)

## 2020-04-07 MED ORDER — NONFORMULARY OR COMPOUNDED ITEM
2000.0000 ug | Freq: Every day | Status: DC
  Administered 2020-04-07 – 2020-04-08 (×2): 2000 ug via ORAL
  Filled 2020-04-07: qty 1

## 2020-04-07 MED ORDER — ADULT MULTIVITAMIN W/MINERALS CH
2.0000 | ORAL_TABLET | Freq: Every day | ORAL | Status: DC
  Administered 2020-04-07 – 2020-04-08 (×2): 2 via ORAL
  Filled 2020-04-07: qty 2

## 2020-04-07 MED ORDER — TAB-A-VITE/IRON PO TABS
2.0000 | ORAL_TABLET | Freq: Every day | ORAL | Status: DC
  Administered 2020-04-07 – 2020-04-08 (×2): 2 via ORAL
  Filled 2020-04-07: qty 2

## 2020-04-07 MED ORDER — VITAMIN D3 1.25 MG (50000 UT) PO CAPS
20000.0000 ug | ORAL_CAPSULE | Freq: Every day | ORAL | Status: DC
  Administered 2020-04-07 – 2020-04-08 (×2): 20000 ug via ORAL
  Filled 2020-04-07: qty 2

## 2020-04-07 MED ORDER — POTASSIUM CHLORIDE IN NACL 20-0.9 MEQ/L-% IV SOLN
INTRAVENOUS | Status: DC
  Filled 2020-04-07 (×2): qty 1000

## 2020-04-07 MED ORDER — BETA CAROTENE 25000 UNITS PO CAPS
50000.0000 [IU] | ORAL_CAPSULE | Freq: Every day | ORAL | Status: DC
  Administered 2020-04-07 – 2020-04-08 (×2): 50000 [IU] via ORAL
  Filled 2020-04-07: qty 2

## 2020-04-07 MED ORDER — VIT C-CHOLECALCIFEROL-ROSE HIP 500-1000-20 MG-UNIT-MG PO CAPS
2.0000 | ORAL_CAPSULE | Freq: Every day | ORAL | Status: DC
  Administered 2020-04-07 – 2020-04-08 (×2): 2 via ORAL
  Filled 2020-04-07: qty 2

## 2020-04-07 NOTE — Progress Notes (Signed)
SURGICAL PROGRESS NOTE   Hospital Day(s): 14.   Post op day(s): 14 Days Post-Op.   Interval History: Patient seen and examined, no acute events or new complaints overnight. Patient reports feeling well.  She denies any nausea or vomiting.  She denies abdominal pain.  She denies any fever.  Vital signs in last 24 hours: [min-max] current  Temp:  [97.8 F (36.6 C)-98.7 F (37.1 C)] 98.2 F (36.8 C) (08/07 0420) Pulse Rate:  [86-96] 91 (08/07 0420) Resp:  [18-20] 20 (08/07 0420) BP: (130-141)/(74-85) 136/74 (08/07 0420) SpO2:  [95 %-99 %] 95 % (08/07 0420)     Height: 5\' 2"  (157.5 cm) Weight: 67.7 kg BMI (Calculated): 27.3   Physical Exam:  Constitutional: alert, cooperative and no distress  Respiratory: breathing non-labored at rest  Cardiovascular: regular rate and sinus rhythm  Gastrointestinal: soft, non-tender, and non-distended.  Wound is dry and clean  Labs:  CBC Latest Ref Rng & Units 04/06/2020 04/02/2020 03/31/2020  WBC 4.0 - 10.5 K/uL 15.7(H) 20.3(H) 17.2(H)  Hemoglobin 12.0 - 15.0 g/dL 10.9(L) 11.9(L) 11.2(L)  Hematocrit 36 - 46 % 31.1(L) 33.8(L) 33.2(L)  Platelets 150 - 400 K/uL 760(H) 516(H) 452(H)   CMP Latest Ref Rng & Units 04/07/2020 04/06/2020 04/05/2020  Glucose 70 - 99 mg/dL 06/05/2020) 852(D) 95  BUN 8 - 23 mg/dL 782(U) <2(P) <5(T)  Creatinine 0.44 - 1.00 mg/dL <6(R 4.43 1.54  Sodium 135 - 145 mmol/L 141 142 141  Potassium 3.5 - 5.1 mmol/L 4.0 3.5 2.9(L)  Chloride 98 - 111 mmol/L 110 113(H) 108  CO2 22 - 32 mmol/L 24 22 20(L)  Calcium 8.9 - 10.3 mg/dL 8.2(L) 8.4(L) 8.2(L)  Total Protein 6.5 - 8.1 g/dL - - -  Total Bilirubin 0.3 - 1.2 mg/dL - - -  Alkaline Phos 38 - 126 U/L - - -  AST 15 - 41 U/L - - -  ALT 0 - 44 U/L - - -    Imaging studies: No new pertinent imaging studies   Assessment/Plan:  74 y.o.femalewith acute perforated appendicitis14Day Post-Ops/p open appendectomy, complicated by pertinent comorbidities includingGERD.  Patient today without  fever, no tachycardia.  No abdominal pain.  Tolerating diet.  We will repeat labs in the morning and assessment for white blood cell trend.  If white blood cells continue to be stable or going down I consider that if patient can be transitioned to oral antibiotic therapy and discharged home.  Patient ambulating.  We will continue with DVT prophylaxis.  We will continue with current IV antibiotic therapy.  66, MD

## 2020-04-07 NOTE — Plan of Care (Signed)
Continuing with plan of care. 

## 2020-04-07 NOTE — Progress Notes (Signed)
PHARMACY CONSULT NOTE  Pharmacy Consult for Electrolyte Monitoring and Replacement   Recent Labs: Potassium (mmol/L)  Date Value  04/07/2020 4.0   Magnesium (mg/dL)  Date Value  57/32/2025 1.9   Calcium (mg/dL)  Date Value  42/70/6237 8.2 (L)   Albumin (g/dL)  Date Value  62/83/1517 3.2 (L)   Phosphorus (mg/dL)  Date Value  61/60/7371 3.4   Sodium (mmol/L)  Date Value  04/07/2020 141   Corrected Ca: 8.84 mg/dL  Assessment: Pharmacy was consulted for electrolyte monitoring and replacement for a 74 yo female with a perforated appendicitis 13 days post-op s/p open appendectomy.   MIVF: 0.9% NaCl w/ 40 mEq KCl/L  Goal of Therapy:  Electrolytes WNL  Plan:   Change MIVF to 0.9% NaCl with KCl 36mEq/L infusion at 75 ml/hr  Re-check electrolytes tomorrow AM and continue to replace as needed.   Lowella Bandy, PharmD Clinical Pharmacist 04/07/2020 7:45 AM

## 2020-04-08 LAB — BASIC METABOLIC PANEL
Anion gap: 8 (ref 5–15)
BUN: 7 mg/dL — ABNORMAL LOW (ref 8–23)
CO2: 25 mmol/L (ref 22–32)
Calcium: 8.8 mg/dL — ABNORMAL LOW (ref 8.9–10.3)
Chloride: 106 mmol/L (ref 98–111)
Creatinine, Ser: 0.67 mg/dL (ref 0.44–1.00)
GFR calc Af Amer: >60 mL/min (ref >60)
GFR calc non Af Amer: >60 mL/min (ref >60)
Glucose, Bld: 117 mg/dL — ABNORMAL HIGH (ref 70–99)
Potassium: 3.9 mmol/L (ref 3.5–5.1)
Sodium: 139 mmol/L (ref 135–145)

## 2020-04-08 LAB — CBC
HCT: 30.4 % — ABNORMAL LOW (ref 36.0–46.0)
Hemoglobin: 10.1 g/dL — ABNORMAL LOW (ref 12.0–15.0)
MCH: 31.1 pg (ref 26.0–34.0)
MCHC: 33.2 g/dL (ref 30.0–36.0)
MCV: 93.5 fL (ref 80.0–100.0)
Platelets: 707 10*3/uL — ABNORMAL HIGH (ref 150–400)
RBC: 3.25 MIL/uL — ABNORMAL LOW (ref 3.87–5.11)
RDW: 15.2 % (ref 11.5–15.5)
WBC: 12.1 10*3/uL — ABNORMAL HIGH (ref 4.0–10.5)
nRBC: 0 % (ref 0.0–0.2)

## 2020-04-08 LAB — MAGNESIUM: Magnesium: 1.9 mg/dL (ref 1.7–2.4)

## 2020-04-08 MED ORDER — AMOXICILLIN-POT CLAVULANATE 875-125 MG PO TABS: 1.0000 | ORAL_TABLET | Freq: Two times a day (BID) | ORAL | 0 refills | Status: AC

## 2020-04-08 MED ORDER — METRONIDAZOLE 500 MG PO TABS
500.0000 mg | ORAL_TABLET | Freq: Three times a day (TID) | ORAL | 0 refills | Status: AC
Start: 2020-04-08 — End: 2020-04-18

## 2020-04-08 MED ORDER — HYDROCODONE-ACETAMINOPHEN 5-325 MG PO TABS: 1.0000 | ORAL_TABLET | ORAL | 0 refills | Status: AC | PRN

## 2020-04-08 NOTE — Plan of Care (Signed)
Continuing with plan of care. 

## 2020-04-08 NOTE — Discharge Instructions (Signed)
  Diet: Resume home heart healthy regular diet.   Activity: No heavy lifting >20 pounds (children, pets, laundry, garbage) or strenuous activity until follow-up, but light activity and walking are encouraged. Do not drive or drink alcohol if taking narcotic pain medications.  Wound care: May shower with soapy water and pat dry (do not rub incisions), but no baths or submerging incision underwater until follow-up. (no swimming)   Medications: Resume all home medications. For mild to moderate pain: acetaminophen (Tylenol) or ibuprofen (if no kidney disease). Combining Tylenol with alcohol can substantially increase your risk of causing liver disease. Narcotic pain medications, if prescribed, can be used for severe pain, though may cause nausea, constipation, and drowsiness. Do not combine Tylenol and Norco within a 6 hour period as Norco contains Tylenol. If you do not need the narcotic pain medication, you do not need to fill the prescription.  Take antibiotic therapy as prescribed.   Call office ([phone]) at any time if any questions, worsening pain, fevers/chills, bleeding, drainage from incision site, or other concerns.  

## 2020-04-08 NOTE — Plan of Care (Signed)
Discharge teaching completed with patient who is in stable condition. 

## 2020-04-08 NOTE — Discharge Summary (Signed)
Patient ID: Sonia Ochoa MRN: [id number] DOB/AGE: 1946/04/01 74 y.o.  Admit date: 03/24/2020 Discharge date: 04/08/2020   Discharge Diagnoses:  Active Problems:   Acute appendicitis with perforation and generalized peritonitis   Procedures: Diagnostic laparoscopy with open appendectomy  Hospital Course: Patient with perforated appendicitis with purulent peritonitis.  She underwent open appendectomy.  She said recovering well but developed intra-abdominal abscess.  The was also complicated by reactive ileus.  Ileus treated with nasogastric tube decompression and bowel rest.  Ileus resolved.  Intra-abdominal abscesses were not amenable for drainage.  She was treated with IV antibiotic therapy.  In the last week the white blood cell has been coming down from 20,000-12,000.  She is tolerating soft diet.  Patient ambulating.  There has been no fevers since postoperative day #6.  The wound has been healing well.  Physical Exam Cardiovascular:     Rate and Rhythm: Normal rate and regular rhythm.  Pulmonary:     Effort: Pulmonary effort is normal.     Breath sounds: Normal breath sounds.  Abdominal:     General: Abdomen is flat. Bowel sounds are normal.     Palpations: Abdomen is soft.     Tenderness: There is no abdominal tenderness.  Neurological:     Mental Status: She is alert and oriented to person, place, and time.      Consults: None  Disposition: Discharge disposition: 01-Home or Self Care       Discharge Instructions    Diet - low sodium heart healthy   Complete by: As directed    Increase activity slowly   Complete by: As directed      Allergies as of 04/08/2020      Reactions   Levofloxacin Other (See Comments)   Reaction: Chest pain and "felt like her heart was going to stop"      Medication List    TAKE these medications   amoxicillin-clavulanate 875-125 MG tablet Commonly known as: Augmentin Take 1 tablet by mouth 2 (two) times daily for 10  days.   beta carotene 87564 UNIT capsule Take 25,000 Units by mouth daily.   cholecalciferol 25 MCG (1000 UNIT) tablet Commonly known as: VITAMIN D3 Take 10,000 Units by mouth daily.   geriatric multivitamins-minerals Liqd Take 15 mLs by mouth daily. This vitamin is Actalin, which can not be found in the Epic database   GOODSENSE IODINE EX Take 1,000 mcg by mouth daily.   HYDROcodone-acetaminophen 5-325 MG tablet Commonly known as: Norco Take 1 tablet by mouth every 4 (four) hours as needed for up to 3 days for moderate pain.   metroNIDAZOLE 500 MG tablet Commonly known as: Flagyl Take 1 tablet (500 mg total) by mouth 3 (three) times daily for 10 days.   MULTIVITAMIN ADULT PO Take 1 tablet by mouth daily.   potassium gluconate 595 (99 K) MG Tabs tablet Take 595 mg by mouth daily.   vitamin C with rose hips 500 MG tablet Take 500 mg by mouth daily.       Follow-up Information    [name], MD. Go on 05/08/2020.   Specialty: Internal Medicine Why: Appt at 9:00 am. They will mail you intake paperwork to complete at home. Please bring completed paperwork, list of medications, and insurance cards. Contact information: [address] [place] [address] [phone]        [facility] Follow up.   Specialty: Home Health Services Why: They will follow up with you for your home  health therapy needs.       Carolan Shiver, MD Follow up in 1 week(s).   Specialty: General Surgery Why: follow up after appendectomy Contact information: [address] [place] [address] [phone]

## 2020-04-08 NOTE — Progress Notes (Signed)
PHARMACY CONSULT NOTE  Pharmacy Consult for Electrolyte Monitoring and Replacement   Recent Labs: Potassium (mmol/L)  Date Value  04/08/2020 3.9   Magnesium (mg/dL)  Date Value  71/01/2693 1.9   Calcium (mg/dL)  Date Value  85/46/2703 8.8 (L)   Albumin (g/dL)  Date Value  50/05/3817 3.2 (L)   Phosphorus (mg/dL)  Date Value  29/93/7169 3.4   Sodium (mmol/L)  Date Value  04/08/2020 139   Corrected Ca: 9.4 mg/dL  Assessment: Pharmacy was consulted for electrolyte monitoring and replacement for a 74 yo female with a perforated appendicitis 15 days post-op s/p open appendectomy.   MIVF: 0.9% NaCl w/ 20 mEq KCl/L  Goal of Therapy:  Electrolytes WNL  Plan:   Continue MIVF 0.9% NaCl with KCl 31mEq/L infusion at 75 ml/hr  Re-check electrolytes tomorrow AM and continue to replace as needed.   Lowella Bandy, PharmD Clinical Pharmacist 04/08/2020 7:34 AM

## 2020-05-17 ENCOUNTER — Other Ambulatory Visit: Payer: Self-pay | Admitting: General Surgery

## 2020-05-17 DIAGNOSIS — K651 Peritoneal abscess: Secondary | ICD-10-CM

## 2020-05-23 ENCOUNTER — Encounter (INDEPENDENT_AMBULATORY_CARE_PROVIDER_SITE_OTHER): Payer: Self-pay

## 2020-05-23 ENCOUNTER — Other Ambulatory Visit: Payer: Self-pay

## 2020-05-23 ENCOUNTER — Ambulatory Visit
Admission: RE | Admit: 2020-05-23 | Discharge: 2020-05-23 | Disposition: A | Discharge status: Home / Self Care | Payer: Medicare Other | Source: Ambulatory Visit | Attending: General Surgery | Admitting: General Surgery

## 2020-05-23 DIAGNOSIS — K651 Peritoneal abscess: Secondary | ICD-10-CM | POA: Diagnosis not present

## 2020-05-23 MED ORDER — IOHEXOL 300 MG/ML  SOLN
100.0000 mL | Freq: Once | INTRAMUSCULAR | Status: AC | PRN
  Administered 2020-05-23: 100 mL via INTRAVENOUS

## 2020-12-11 ENCOUNTER — Emergency Department
Admission: EM | Admit: 2020-12-11 | Discharge: 2020-12-11 | Disposition: A | Discharge status: Home / Self Care | Payer: Medicare Other | Attending: Emergency Medicine | Admitting: Emergency Medicine

## 2020-12-11 ENCOUNTER — Other Ambulatory Visit: Payer: Self-pay

## 2020-12-11 ENCOUNTER — Emergency Department: Payer: Medicare Other

## 2020-12-11 DIAGNOSIS — R1084 Generalized abdominal pain: Secondary | ICD-10-CM

## 2020-12-11 DIAGNOSIS — K921 Melena: Secondary | ICD-10-CM | POA: Insufficient documentation

## 2020-12-11 LAB — CBC WITH DIFFERENTIAL/PLATELET
Abs Immature Granulocytes: 0.03 10*3/uL (ref 0.00–0.07)
Basophils Absolute: 0.1 10*3/uL (ref 0.0–0.1)
Basophils Relative: 1 %
Eosinophils Absolute: 0.2 10*3/uL (ref 0.0–0.5)
Eosinophils Relative: 2 %
HCT: 38.9 % (ref 36.0–46.0)
Hemoglobin: 13.6 g/dL (ref 12.0–15.0)
Immature Granulocytes: 0 %
Lymphocytes Relative: 41 %
Lymphs Abs: 3.9 10*3/uL (ref 0.7–4.0)
MCH: 31.7 pg (ref 26.0–34.0)
MCHC: 35 g/dL (ref 30.0–36.0)
MCV: 90.7 fL (ref 80.0–100.0)
Monocytes Absolute: 0.7 10*3/uL (ref 0.1–1.0)
Monocytes Relative: 8 %
Neutro Abs: 4.6 10*3/uL (ref 1.7–7.7)
Neutrophils Relative %: 48 %
Platelets: 264 10*3/uL (ref 150–400)
RBC: 4.29 MIL/uL (ref 3.87–5.11)
RDW: 12.5 % (ref 11.5–15.5)
WBC: 9.5 10*3/uL (ref 4.0–10.5)
nRBC: 0 % (ref 0.0–0.2)

## 2020-12-11 LAB — COMPREHENSIVE METABOLIC PANEL
ALT: 23 U/L (ref 0–44)
AST: 27 U/L (ref 15–41)
Albumin: 3.8 g/dL (ref 3.5–5.0)
Alkaline Phosphatase: 65 U/L (ref 38–126)
Anion gap: 11 (ref 5–15)
BUN: 10 mg/dL (ref 8–23)
CO2: 26 mmol/L (ref 22–32)
Calcium: 9.4 mg/dL (ref 8.9–10.3)
Chloride: 97 mmol/L — ABNORMAL LOW (ref 98–111)
Creatinine, Ser: 0.71 mg/dL (ref 0.44–1.00)
GFR, Estimated: >60 mL/min (ref >60)
Glucose, Bld: 105 mg/dL — ABNORMAL HIGH (ref 70–99)
Potassium: 3.7 mmol/L (ref 3.5–5.1)
Sodium: 134 mmol/L — ABNORMAL LOW (ref 135–145)
Total Bilirubin: 0.7 mg/dL (ref 0.3–1.2)
Total Protein: 6.8 g/dL (ref 6.5–8.1)

## 2020-12-11 NOTE — ED Triage Notes (Signed)
Pt presents to the [facility] via EMS from home with c/o dark and bright red blood in stool. Pt states that she began having diarrhea last night and noticed the blood in stool. Pt reports 3 more episodes of diarrhea today with increased amount of blood in stool. Pt also reports abd distention. Pt currently A&Ox4 with stable vital signs.

## 2020-12-11 NOTE — Discharge Instructions (Signed)
Your blood counts today were normal, and your stool test shows that there is no active gastrointestinal bleeding.

## 2020-12-11 NOTE — ED Provider Notes (Signed)
[facility] Emergency Department Provider Note  ____________________________________________  Time seen: Approximately 3:13 PM  I have reviewed the triage vital signs and the nursing notes.   HISTORY  Chief Complaint Blood In Stools    HPI Sonia Ochoa is a 75 y.o. female with a history of hyperlipidemia and diverticulitis who comes ED complaining of generalized abdominal pain as well as black stool and bloody stool that started last night.  She reports multiple episodes of noticing bloody stool.  Also feels that her abdomen is distended.  Denies fever chills chest pain or shortness of breath, no lightheadedness.  Does not take blood thinners.  Pain is nonradiating, mild, no aggravating or alleviating factors.  Passing stool and gas.  No vomiting.  Does note that she had pizza for dinner last night, also takes an iron supplement intermittently including last night.      Past Medical History:  Diagnosis Date  . Diverticulitis   . Hyperlipidemia   . Reflex sympathetic dystrophy      Patient Active Problem List   Diagnosis Date Noted  . Acute appendicitis with perforation and generalized peritonitis 03/24/2020  . Acute diverticulitis 11/08/2017     Past Surgical History:  Procedure Laterality Date  . APPENDECTOMY  03/24/2020   Procedure: APPENDECTOMY;  Surgeon: Herbert Pun, MD;  Location: [facility] ORS;  Service: General;;     Prior to Admission medications   Medication Sig Start Date End Date Taking? Authorizing Provider  Ascorbic Acid (VITAMIN C WITH ROSE HIPS) 500 MG tablet Take 500 mg by mouth daily.    [provider]  beta carotene 25000 UNIT capsule Take 25,000 Units by mouth daily.    [provider]  cholecalciferol (VITAMIN D3) 25 MCG (1000 UNIT) tablet Take 10,000 Units by mouth daily.    [provider]  geriatric multivitamins-minerals (ELDERTONIC/GEVRABON) LIQD Take 15 mLs by mouth daily. This  vitamin is Actalin, which can not be found in the [employer], Historical, MD  [place] Take 1,000 mcg by mouth daily.    [provider]  Multiple Vitamin (MULTIVITAMIN ADULT PO) Take 1 tablet by mouth daily.    [provider]  potassium gluconate 595 (99 K) MG TABS tablet Take 595 mg by mouth daily.    [provider]     Allergies Levofloxacin   Family History  Problem Relation Age of Onset  . Multiple myeloma Father   . CAD Father   . Diabetes Sister     Social History Social History   Tobacco Use  . Smoking status: Never Smoker  . Smokeless tobacco: Never Used  Substance Use Topics  . Alcohol use: Yes    Comment: social drinking  . Drug use: No    Review of Systems  Constitutional:   No fever or chills.  ENT:   No sore throat. No rhinorrhea. Cardiovascular:   No chest pain or syncope. Respiratory:   No dyspnea or cough. Gastrointestinal:   Positive as above for abdominal pain, loose stools, bloody stool Musculoskeletal:   Negative for focal pain or swelling All other systems reviewed and are negative except as documented above in ROS and HPI.  ____________________________________________   PHYSICAL EXAM:  VITAL SIGNS: ED Triage Vitals [12/11/20 1418]  Enc Vitals Group     BP (!) 148/86     Pulse Rate 85     Resp 15     Temp 98 F (36.7 C)  Temp Source Oral     SpO2 97 %     Weight 154 lb 5.2 oz (70 kg)     Height 5' 2"  (1.575 m)     Head Circumference      Peak Flow      Pain Score 3     Pain Loc      Pain Edu?      Excl. in [place]?     Vital signs reviewed, nursing assessments reviewed.   Constitutional:   Alert and oriented. Non-toxic appearance. Eyes:   Conjunctivae are normal. EOMI. PERRL. ENT      Head:   Normocephalic and atraumatic.      Nose:   Wearing a mask.      Mouth/Throat:   Wearing a mask.      Neck:   No meningismus. Full ROM. Hematological/Lymphatic/Immunilogical:   No  cervical lymphadenopathy. Cardiovascular:   RRR. Symmetric bilateral radial and DP pulses.  No murmurs. Cap refill less than 2 seconds. Respiratory:   Normal respiratory effort without tachypnea/retractions. Breath sounds are clear and equal bilaterally. No wheezes/rales/rhonchi. Gastrointestinal:   Soft and nontender. Non distended. There is no CVA tenderness.  No rebound, rigidity, or guarding.  Rectal exam performed with nurse Thedore Mins at bedside.  No hemorrhoids.  Brownish-red stool, Hemoccult negative, control is okay  Musculoskeletal:   Normal range of motion in all extremities. No joint effusions.  No lower extremity tenderness.  No edema. Neurologic:   Normal speech and language.  Motor grossly intact. No acute focal neurologic deficits are appreciated.  Skin:    Skin is warm, dry and intact. No rash noted.  No petechiae, purpura, or bullae.  ____________________________________________    LABS (pertinent positives/negatives) (all labs ordered are listed, but only abnormal results are displayed) Labs Reviewed  CBC WITH DIFFERENTIAL/PLATELET  COMPREHENSIVE METABOLIC PANEL   ____________________________________________   EKG    ____________________________________________    RADIOLOGY  No results found.  ____________________________________________   PROCEDURES Procedures  ____________________________________________    CLINICAL IMPRESSION / ASSESSMENT AND PLAN / ED COURSE  Medications ordered in the ED: Medications - No data to display  Pertinent labs & imaging results that were available during my care of the patient were reviewed by me and considered in my medical decision making (see chart for details).  Sonia Ochoa was evaluated in Emergency Department on 12/11/2020 for the symptoms described in the history of present illness. She was evaluated in the context of the global COVID-19 pandemic, which necessitated consideration that the patient might be  at risk for infection with the SARS-CoV-2 virus that causes COVID-19. Institutional protocols and algorithms that pertain to the evaluation of patients at risk for COVID-19 are in a state of rapid change based on information released by regulatory bodies including the CDC and federal and state organizations. These policies and algorithms were followed during the patient's care in the ED.   Patient presents with vague abdominal symptoms and worry about GI bleed.  Hemoccult is negative, red stool that she has noticed appears to be food pigment, intermittent black stools that she reports appear to be due to iron supplement.  Vital signs are normal, CBC is normal.  Care signed out to oncoming provider to follow-up on chemistry panel and x-ray at which point I think she is stable for discharge unless there are severe findings identified.      ____________________________________________   FINAL CLINICAL IMPRESSION(S) / ED DIAGNOSES    Final diagnoses:  Generalized abdominal pain  ED Discharge Orders    None      Portions of this note were generated with dragon dictation software. Dictation errors may occur despite best attempts at proofreading.   Carrie Mew, MD 12/11/20 1515

## 2021-11-26 IMAGING — CR DG ABDOMEN ACUTE W/ 1V CHEST
4 series · 4 of 4 positions shown · non-contrast
Comparison: CT 05/23/2020

CLINICAL DATA: Generalized abdominal pain. Diarrhea with blood in
stool.

EXAM:
DG ABDOMEN ACUTE WITH 1 VIEW CHEST

[chest pa]
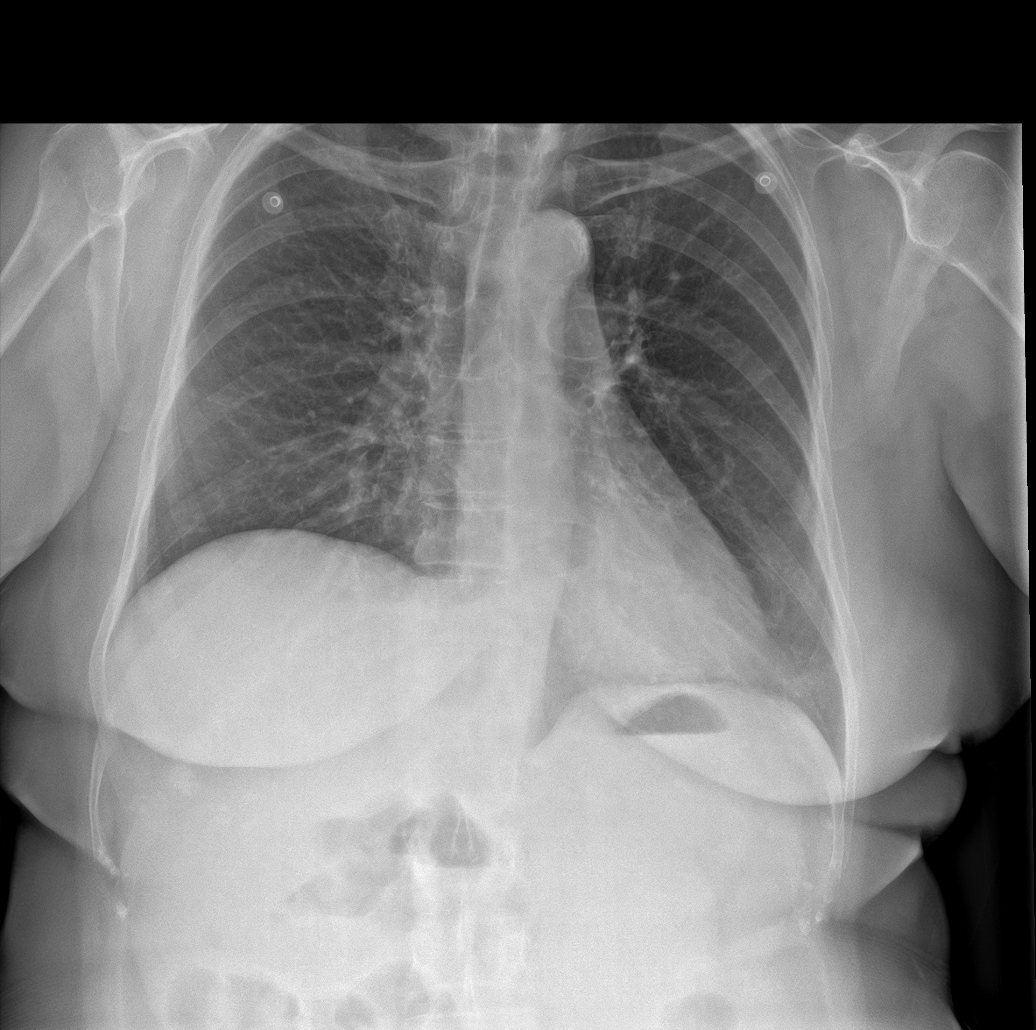

[abdomen erect]
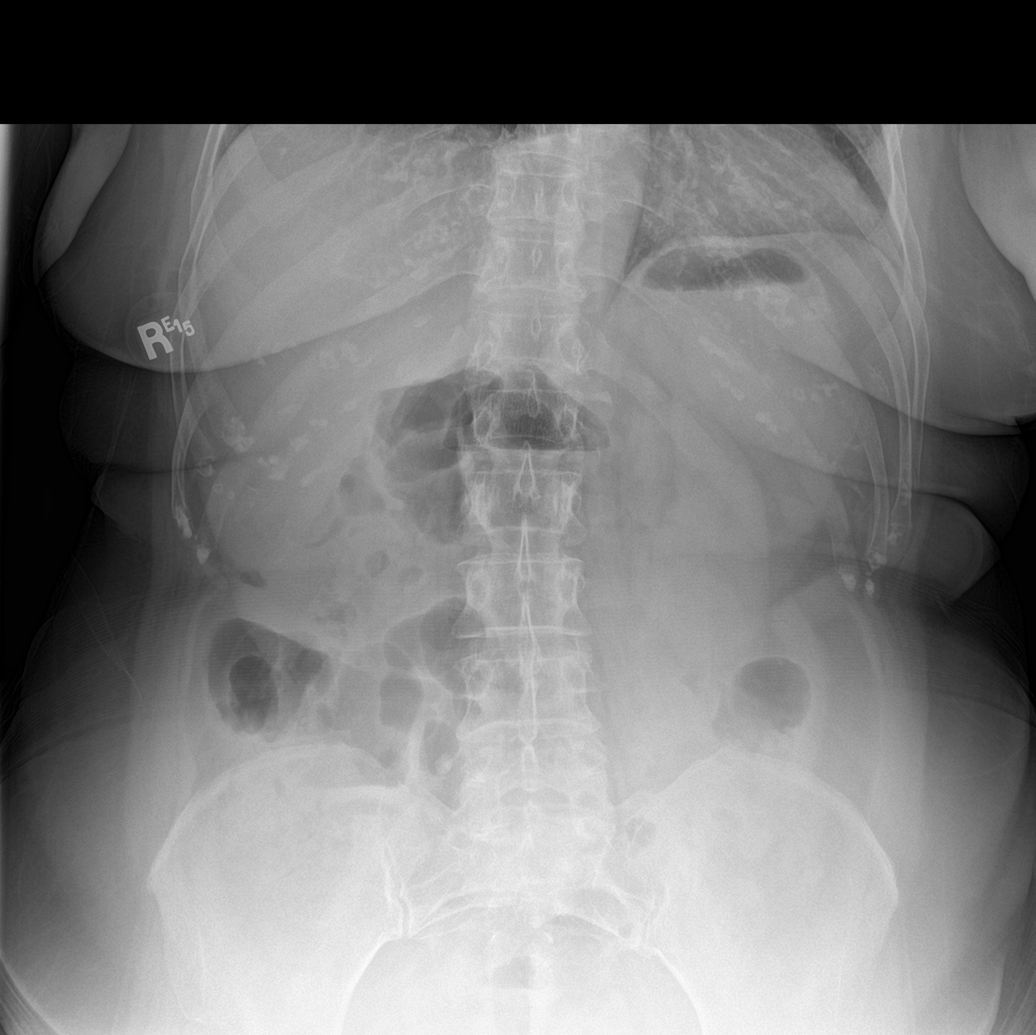

[abdomen supine (1 of 2)]
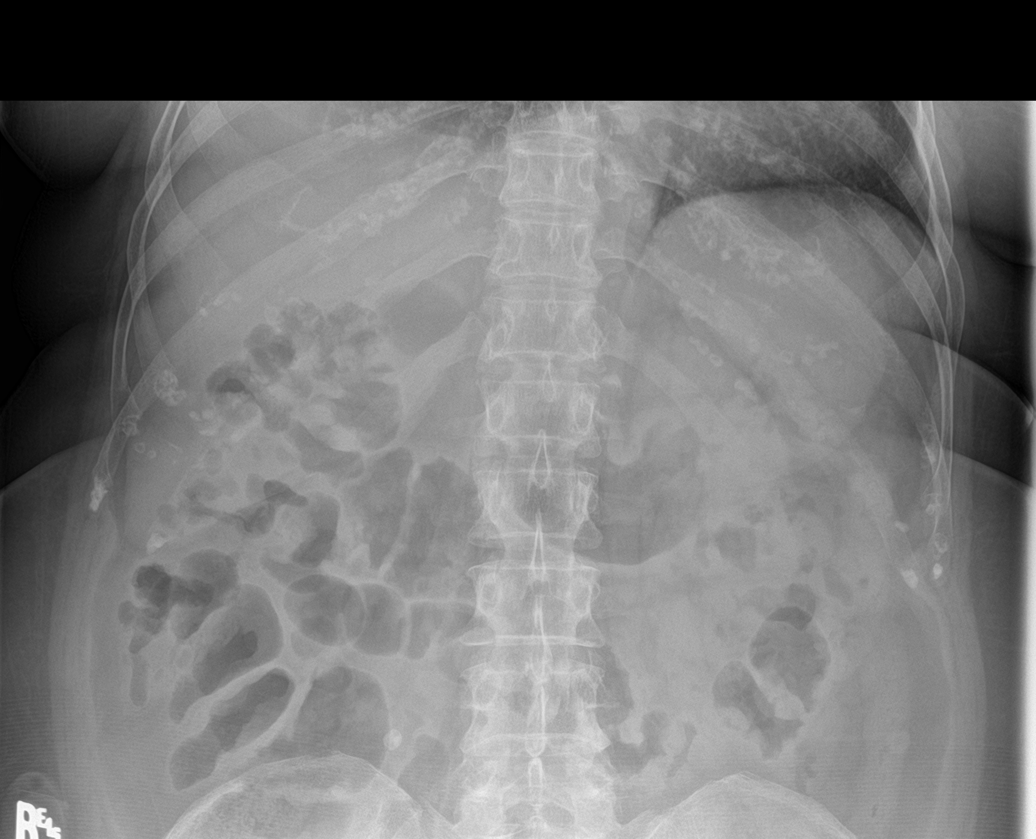

[abdomen supine (2 of 2)]
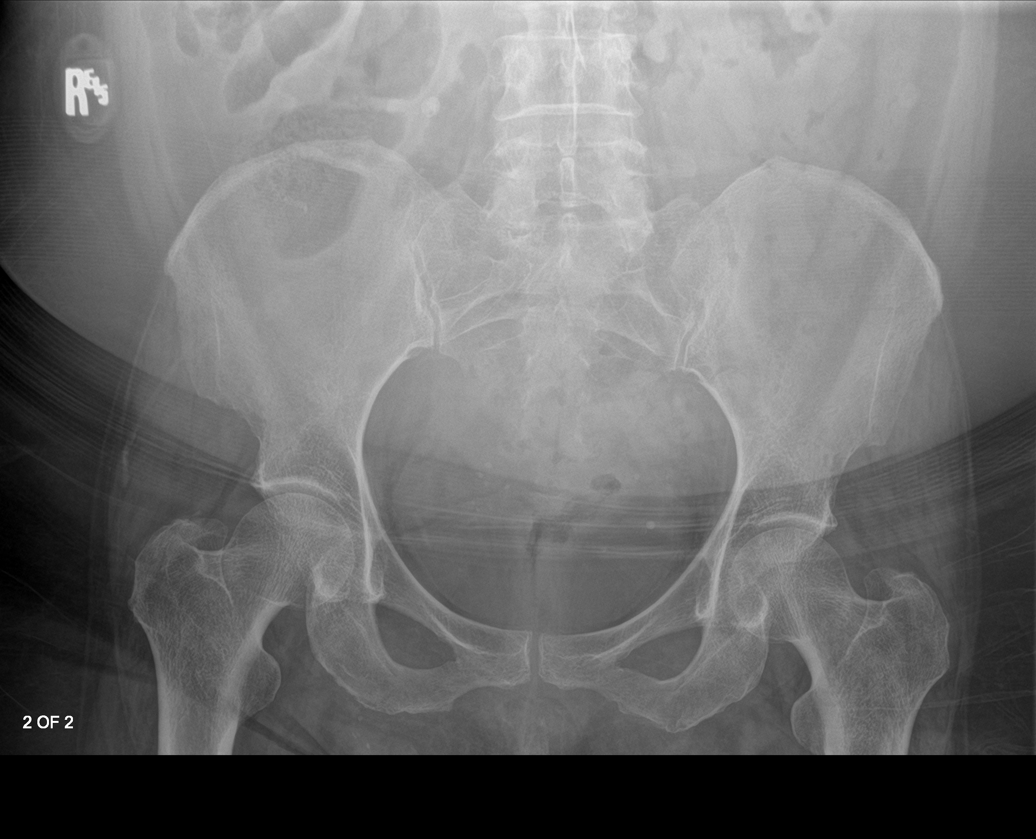

[4 of 4 positions shown; findings below may reference images not displayed]

FINDINGS: Heart is normal in size with normal mediastinal contours. Aortic
atherosclerosis. Mild elevation of right hemidiaphragm. No acute or
focal airspace disease. No pulmonary edema, pleural effusion or
pneumothorax.

No free intra-abdominal air. No small bowel dilatation or evidence
of obstruction. Slight increased air throughout the ascending and
transverse colon. Small volume of formed stool in the right colon.
No radiopaque calculi or abnormal soft tissue calcifications. Pelvic
calcifications are consistent with phleboliths.

No acute osseous abnormalities are seen.
IMPRESSION: 1. Slight increased air throughout the ascending and transverse
colon which can be seen with diarrheal process. No evidence of
obstruction or free air.
2. No acute chest findings.
